# Patient Record
Sex: Male | Born: 1945 | Race: White | Hispanic: No | Marital: Married | Smoking: Never smoker
Health system: Southern US, Community
[De-identification: ages and names within clinical notes are randomized; demographics above are authoritative.]

## PROBLEM LIST (undated history)

## (undated) DIAGNOSIS — J329 Chronic sinusitis, unspecified: Secondary | ICD-10-CM

## (undated) DIAGNOSIS — G4733 Obstructive sleep apnea (adult) (pediatric): Secondary | ICD-10-CM

## (undated) DIAGNOSIS — F419 Anxiety disorder, unspecified: Secondary | ICD-10-CM

## (undated) DIAGNOSIS — K219 Gastro-esophageal reflux disease without esophagitis: Secondary | ICD-10-CM

## (undated) DIAGNOSIS — F4310 Post-traumatic stress disorder, unspecified: Secondary | ICD-10-CM

## (undated) DIAGNOSIS — G629 Polyneuropathy, unspecified: Secondary | ICD-10-CM

## (undated) DIAGNOSIS — M545 Low back pain, unspecified: Secondary | ICD-10-CM

## (undated) DIAGNOSIS — J309 Allergic rhinitis, unspecified: Secondary | ICD-10-CM

## (undated) DIAGNOSIS — G894 Chronic pain syndrome: Secondary | ICD-10-CM

## (undated) DIAGNOSIS — Z9989 Dependence on other enabling machines and devices: Secondary | ICD-10-CM

## (undated) DIAGNOSIS — M1990 Unspecified osteoarthritis, unspecified site: Secondary | ICD-10-CM

## (undated) DIAGNOSIS — M5416 Radiculopathy, lumbar region: Secondary | ICD-10-CM

## (undated) DIAGNOSIS — IMO0002 Reserved for concepts with insufficient information to code with codable children: Secondary | ICD-10-CM

## (undated) DIAGNOSIS — J45909 Unspecified asthma, uncomplicated: Secondary | ICD-10-CM

## (undated) DIAGNOSIS — J449 Chronic obstructive pulmonary disease, unspecified: Secondary | ICD-10-CM

## (undated) DIAGNOSIS — E162 Hypoglycemia, unspecified: Secondary | ICD-10-CM

## (undated) DIAGNOSIS — I10 Essential (primary) hypertension: Secondary | ICD-10-CM

## (undated) DIAGNOSIS — M4306 Spondylolysis, lumbar region: Secondary | ICD-10-CM

## (undated) DIAGNOSIS — C61 Malignant neoplasm of prostate: Secondary | ICD-10-CM

## (undated) DIAGNOSIS — J40 Bronchitis, not specified as acute or chronic: Secondary | ICD-10-CM

## (undated) HISTORY — PX: MOHS SURGERY: SUR867

## (undated) HISTORY — PX: UPPER GI ENDOSCOPY: SHX6162

## (undated) HISTORY — PX: COLONOSCOPY: SHX174

## (undated) HISTORY — PX: BACK SURGERY: SHX140

---

## 2013-02-02 ENCOUNTER — Telehealth: Payer: Self-pay | Admitting: Genetic Counselor

## 2013-02-02 NOTE — Telephone Encounter (Signed)
S/W PT IN RE TO GENETIC APPT 07/10 @ 1 W/KAREN POWELL. WELCOME PACKET MAILED.

## 2013-03-11 ENCOUNTER — Telehealth: Payer: Self-pay | Admitting: Oncology

## 2013-03-11 ENCOUNTER — Other Ambulatory Visit: Payer: Self-pay | Admitting: Lab

## 2013-03-11 ENCOUNTER — Encounter: Payer: Medicare Other | Admitting: Genetic Counselor

## 2013-03-11 NOTE — Telephone Encounter (Signed)
PT SCHEDULED TO SEE DR. SHADAD 7/29 @ 10:30.  WELCOME PACKET MAILED.

## 2013-03-26 ENCOUNTER — Other Ambulatory Visit: Payer: Self-pay | Admitting: Oncology

## 2013-03-26 DIAGNOSIS — C61 Malignant neoplasm of prostate: Secondary | ICD-10-CM

## 2013-03-30 ENCOUNTER — Other Ambulatory Visit (HOSPITAL_BASED_OUTPATIENT_CLINIC_OR_DEPARTMENT_OTHER): Payer: Medicare Other | Admitting: Lab

## 2013-03-30 ENCOUNTER — Telehealth: Payer: Self-pay | Admitting: Oncology

## 2013-03-30 ENCOUNTER — Encounter: Payer: Self-pay | Admitting: Oncology

## 2013-03-30 ENCOUNTER — Ambulatory Visit: Payer: Medicare Other

## 2013-03-30 ENCOUNTER — Ambulatory Visit (HOSPITAL_BASED_OUTPATIENT_CLINIC_OR_DEPARTMENT_OTHER): Payer: Medicare Other | Admitting: Oncology

## 2013-03-30 VITALS — BP 108/81 | HR 66 | Temp 97.1°F | Resp 19 | Ht 69.0 in | Wt 224.5 lb

## 2013-03-30 DIAGNOSIS — C61 Malignant neoplasm of prostate: Secondary | ICD-10-CM

## 2013-03-30 LAB — COMPREHENSIVE METABOLIC PANEL (CC13)
Albumin: 3.9 g/dL (ref 3.5–5.0)
Alkaline Phosphatase: 53 U/L (ref 40–150)
BUN: 20.5 mg/dL (ref 7.0–26.0)
CO2: 28 mEq/L (ref 22–29)
Calcium: 9.7 mg/dL (ref 8.4–10.4)
Glucose: 88 mg/dl (ref 70–140)
Potassium: 3.7 mEq/L (ref 3.5–5.1)
Sodium: 143 mEq/L (ref 136–145)
Total Protein: 7 g/dL (ref 6.4–8.3)

## 2013-03-30 LAB — CBC WITH DIFFERENTIAL/PLATELET
Basophils Absolute: 0 10*3/uL (ref 0.0–0.1)
Eosinophils Absolute: 0.1 10*3/uL (ref 0.0–0.5)
HGB: 16.1 g/dL (ref 13.0–17.1)
MCV: 90.2 fL (ref 79.3–98.0)
MONO#: 0.4 10*3/uL (ref 0.1–0.9)
MONO%: 7.3 % (ref 0.0–14.0)
NEUT#: 3.5 10*3/uL (ref 1.5–6.5)
Platelets: 166 10*3/uL (ref 140–400)
RBC: 5.15 10*6/uL (ref 4.20–5.82)
RDW: 14.9 % — ABNORMAL HIGH (ref 11.0–14.6)
WBC: 5.6 10*3/uL (ref 4.0–10.3)

## 2013-03-30 LAB — TESTOSTERONE: Testosterone: 186 ng/dL — ABNORMAL LOW (ref 300–890)

## 2013-03-30 LAB — PSA: PSA: 5.95 ng/mL — ABNORMAL HIGH (ref <=4.00)

## 2013-03-30 NOTE — Telephone Encounter (Signed)
Pt sched with Dr. Laverle Patter Aug 12@ 4:15pm

## 2013-03-30 NOTE — Progress Notes (Signed)
Note dictated

## 2013-03-30 NOTE — Progress Notes (Signed)
Checked in new pt with no financial concerns. °

## 2013-03-31 NOTE — Progress Notes (Signed)
CC:   Maurice Farmer  REASON FOR CONSULTATION:  Prostate cancer.  HISTORY OF PRESENT ILLNESS:  Maurice Farmer is a pleasant gentleman currently of [place], [place], where he lived the majority of his life.  He is currently retired.  He worked in [employer], predominantly in Airline pilot.  He is a gentleman with a past medical history significant for asthma, GERD and hyperlipidemia, who he gets his routine medical care in [place] as well as the [facility], where he gets his medication and part of his primary care.  He was noted over the last year and a half a slight elevation in his PSA.  His PSA was up to 12.7 back in March of 2014 and previous to that in __________, his PSA was normal at 2.7.  He was initially treated for possible prostatitis by Dr. Barton Dubois, a local urologist in [place], [place].  Subsequently in May of 2014, his PSA dropped down to 7.1.  At that time it was felt, given the fact that his PSA was persistently high, a biopsy would be recommended.  Then on Jan 12, 2013, he underwent a prostatic biopsy. The pathology report was reviewed today, and the case number is [id number], showed a small focus of atypical glands that are highly suspicious for prostatic adenocarcinoma.  That is the prostate of the right upper lobe.  There are benign seminal vesicles.  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  At that time, he was recommended by his local urologist to have a repeat biopsy and he discussed this with his primary care physician, who recommended referral to the [facility] for another opinion. Clinically, Maurice Farmer is mildly symptomatic.  He does report symptoms of fatigue and tiredness, but he had been on testosterone supplement in the past that have been discontinued and he feels slightly sluggish from that.  He has also had  a problem with erectile dysfunction, but he has not had any major urination issues.  He does report some frequency, especially when he drinks excessive amounts of water at nighttime and some nocturia, but really no hesitancy, no hematuria and incontinence.  REVIEW OF SYSTEMS:  Rest of the review of systems:  He did not report any headaches or blurry vision or double vision.  Did not report any abdominal pain.  Does not report any shortness of breath or cough or hemoptysis.  Is not reporting any bony pains.  Did not report back pain. He does have arthritic hip pain, but that has been chronic and has not really changed in nature.  Did not report any GI symptoms.  Rest of review of systems is unremarkable.  PAST MEDICAL HISTORY:  Significant for asthma, GERD, hyperlipidemia, and hypertension.  FAMILY HISTORY:  Really no history of any malignant disorders.  No history of any prostate cancer or genitourinary cancers.  SOCIAL HISTORY:  He is married.  He is retired.  Chews tobacco.  Denied any alcohol abuse.  MEDICATIONS:  He is currently on albuterol, Xanax, aspirin, Lipitor, __________, vitamin D, Benadryl as needed, fish oil supplements, Flonase, Foradil, nitroglycerin, Prilosec, Percocet, Restoril and vitamin C.  ALLERGIES:  None.  PHYSICAL EXAMINATION:  Alert, awake gentleman, appeared in no active distress.  His blood pressure is 108/81, pulse 66, respiration 19, temperature is 97.  Weight is 224 pounds.  Head is normocephalic, atraumatic.  Pupils equal and round, reactive to light.  Oral mucosa moist and pink.  Neck:  Supple.  No lymphadenopathy.  Heart:  Regular rate and rhythm.  S1 and S2.  Lungs:  Clear to auscultation.  No rhonchi, wheeze, dullness to percussion.  Abdomen:  Soft, nontender.  No hepatosplenomegaly.  Extremities:  No clubbing, cyanosis or edema. Neurological:  Intact motor, sensory and deep tendon reflexes.  LABORATORY DATA:  Hemoglobin of 16, white count  5.6, platelet count 166. He had chemistries and normal alkaline phosphatase, normal liver function tests.  ASSESSMENT AND PLAN:  This is a pleasant 67 year old gentleman found to have an elevated PSA of 12.7 back in March 2014, went down to 7.1 in May of 2014, and underwent a biopsy that is suggestive of possible neoplasm and possible adenocarcinoma of the prostate.  I had a lengthy discussion today with Maurice Farmer, discussing the natural course of prostate cancer as well as the different kinds as well as Gleason score and PSA implications, as well as different treatment modalities should he have developed prostate cancer.  At this time I do concur that he needs a repeat biopsy, also probably a review of the previous biopsy as well. Once that repeat biopsy had been accomplished, I have discussed with him the risks and benefits of different treatment approaches that include observation, surveillance, external beam radiation and possible brachytherapy and radical prostatectomy and all potential treatment options; but at this point, I think we have to start with confirming the diagnosis and also not only confirming the presence of prostate cancer, but also stratification with a Gleason score and possibly prostatic MRI to really determine the best course of action.  I will make the referral to [facility], specifically Dr. Heloise Purpura, for urological evaluation and after that, a further medical oncology followup will be dictated by the course of treatment.  All his questions were answered today.    ______________________________ Benjiman Core, M.D. FNS/MEDQ  D:  03/30/2013  T:  03/31/2013  Job:  [id number]

## 2013-04-12 ENCOUNTER — Telehealth: Payer: Self-pay | Admitting: Oncology

## 2013-04-12 NOTE — Telephone Encounter (Signed)
Faxed pt medical records to [facility] °

## 2013-05-18 ENCOUNTER — Ambulatory Visit: Payer: Medicare Other

## 2013-05-18 ENCOUNTER — Ambulatory Visit: Payer: Medicare Other | Admitting: Radiation Oncology

## 2013-07-08 ENCOUNTER — Other Ambulatory Visit: Payer: Self-pay

## 2013-07-14 ENCOUNTER — Other Ambulatory Visit (HOSPITAL_COMMUNITY): Payer: Self-pay | Admitting: Urology

## 2013-07-14 DIAGNOSIS — C61 Malignant neoplasm of prostate: Secondary | ICD-10-CM

## 2013-07-30 ENCOUNTER — Ambulatory Visit (HOSPITAL_COMMUNITY)
Admission: RE | Admit: 2013-07-30 | Discharge: 2013-07-30 | Disposition: A | Discharge status: Home / Self Care | Payer: Medicare Other | Source: Ambulatory Visit | Attending: Urology | Admitting: Urology

## 2013-07-30 ENCOUNTER — Other Ambulatory Visit (HOSPITAL_COMMUNITY): Payer: Self-pay | Admitting: Urology

## 2013-07-30 DIAGNOSIS — C61 Malignant neoplasm of prostate: Secondary | ICD-10-CM

## 2013-07-30 DIAGNOSIS — N400 Enlarged prostate without lower urinary tract symptoms: Secondary | ICD-10-CM | POA: Insufficient documentation

## 2013-07-30 DIAGNOSIS — N433 Hydrocele, unspecified: Secondary | ICD-10-CM | POA: Insufficient documentation

## 2013-07-30 MED ORDER — GADOBENATE DIMEGLUMINE 529 MG/ML IV SOLN
20.0000 mL | Freq: Once | INTRAVENOUS | Status: AC | PRN
  Administered 2013-07-30: 20 mL via INTRAVENOUS

## 2017-02-07 ENCOUNTER — Other Ambulatory Visit: Payer: Self-pay | Admitting: Urology

## 2017-02-07 DIAGNOSIS — C61 Malignant neoplasm of prostate: Secondary | ICD-10-CM

## 2017-03-10 ENCOUNTER — Ambulatory Visit
Admission: RE | Admit: 2017-03-10 | Discharge: 2017-03-10 | Disposition: A | Discharge status: Home / Self Care | Payer: Medicare Other | Source: Ambulatory Visit | Attending: Urology | Admitting: Urology

## 2017-03-10 DIAGNOSIS — C61 Malignant neoplasm of prostate: Secondary | ICD-10-CM

## 2017-03-10 MED ORDER — GADOBENATE DIMEGLUMINE 529 MG/ML IV SOLN
20.0000 mL | Freq: Once | INTRAVENOUS | Status: AC | PRN
  Administered 2017-03-10: 20 mL via INTRAVENOUS

## 2017-10-03 HISTORY — PX: PROSTATE BIOPSY: SHX241

## 2017-12-30 ENCOUNTER — Telehealth: Payer: Self-pay | Admitting: Medical Oncology

## 2017-12-30 NOTE — Telephone Encounter (Signed)
I called pt to introduce myself as the Prostate Nurse Navigator and the Coordinator of the Prostate [place]. He states he is currently in [place] visiting his daughter who just had a baby. He will return to the states 01/07/09.  1. I confirmed with the patient he is aware of his referral to the clinic 01/09/18 arriving at 8:30am.  2. I discussed the format of the clinic and the physicians he will be seeing that day.  3. I discussed where the clinic is located and how to contact me.  4. I confirmed his address and informed him I would be mailing a packet of information and forms to be completed. I asked him to bring them with him the day of his appointment.   He voiced understanding of the above. I asked him to call me if he has any questions or concerns regarding his appointments or the forms he needs to complete.

## 2018-01-08 ENCOUNTER — Telehealth: Payer: Self-pay | Admitting: Medical Oncology

## 2018-01-08 NOTE — Progress Notes (Signed)
GU Location of Tumor / Histology: prostatic adenocarcinoma  If Prostate Cancer, Gleason Score is (3 + 4) and PSA is (8.79) on 10/03/2017. Volume: 71.8 cc  Maurice Farmer has been under active surveillance since August 2014. He was noted to have progression by upgrading in February 2019. Oncotype Dx was suggestive of a more aggressive tumor.   Biopsies of prostate (if applicable) revealed:    Past/Anticipated interventions by urology, if any: prostate biopsy, active surveillance, prostate biopsy, oncotype Dx, referral to [facility]  Past/Anticipated interventions by medical oncology, if any: no  Weight changes, if any: no  Bowel/Bladder complaints, if any: IPSS 9   Nausea/Vomiting, if any: no  Pain issues, if any:  no  SAFETY ISSUES:  Prior radiation? no  Pacemaker/ICD? no  Possible current pregnancy? no  Is the patient on methotrexate? no  Current Complaints / other details:  72 year old male.

## 2018-01-08 NOTE — Telephone Encounter (Signed)
Spoke with [name] to confirm appointment for [facility] 01/09/18 arriving at 8:00am. I reviewed location, valet parking, registration and reminded him to bring his completed medical forms. He voiced understanding.

## 2018-01-09 ENCOUNTER — Ambulatory Visit
Admission: RE | Admit: 2018-01-09 | Discharge: 2018-01-09 | Disposition: A | Discharge status: Home / Self Care | Payer: Medicare Other | Source: Ambulatory Visit | Attending: Radiation Oncology | Admitting: Radiation Oncology

## 2018-01-09 ENCOUNTER — Encounter: Payer: Self-pay | Admitting: General Practice

## 2018-01-09 ENCOUNTER — Telehealth: Payer: Self-pay | Admitting: Oncology

## 2018-01-09 ENCOUNTER — Inpatient Hospital Stay: Payer: Medicare Other | Attending: Oncology | Admitting: Oncology

## 2018-01-09 ENCOUNTER — Encounter: Payer: Self-pay | Admitting: Radiation Oncology

## 2018-01-09 ENCOUNTER — Encounter: Payer: Self-pay | Admitting: Medical Oncology

## 2018-01-09 ENCOUNTER — Other Ambulatory Visit: Payer: Self-pay

## 2018-01-09 VITALS — BP 109/88 | HR 95 | Temp 98.3°F | Resp 18 | Ht 70.0 in | Wt 232.2 lb

## 2018-01-09 DIAGNOSIS — Z79899 Other long term (current) drug therapy: Secondary | ICD-10-CM | POA: Insufficient documentation

## 2018-01-09 DIAGNOSIS — Z7982 Long term (current) use of aspirin: Secondary | ICD-10-CM | POA: Insufficient documentation

## 2018-01-09 DIAGNOSIS — Z85828 Personal history of other malignant neoplasm of skin: Secondary | ICD-10-CM | POA: Diagnosis not present

## 2018-01-09 DIAGNOSIS — C61 Malignant neoplasm of prostate: Secondary | ICD-10-CM | POA: Diagnosis present

## 2018-01-09 HISTORY — DX: Malignant neoplasm of prostate: C61

## 2018-01-09 NOTE — Telephone Encounter (Signed)
NO LOS 5/10 °

## 2018-01-09 NOTE — Consult Note (Signed)
Multi-Disciplinary Clinic     01/09/2018   --------------------------------------------------------------------------------   Maurice Farmer  MRN: [id number]  PRIMARY CARE:  Orma Render  DOB: 1945-11-13, 72 year old Male  REFERRING:  Zola Button, MD  SSN:-[id number]  PROVIDER:  Raynelle Bring, M.D.    LOCATION:  [facility] [phone]   --------------------------------------------------------------------------------   CC/HPI: CC: Prostate Cancer   PCP: Dr. Sallee Provencal  Location of consult: [facility]   Maurice Farmer follows up today for further discussion regarding options for management/treatment of his prostate cancer. He was recently diagnosed with upgraded Gleason 3+4=7 adenocarcinoma although still have relatively low volume disease. We did discuss continued active surveillance. Since his last visit, he did undergo Oncotype DX testing. This did suggest a worst risk classification including a 77% risk of having Gleason 4+3=7 disease or locally advanced disease at the time of radical prostatectomy. At also suggested an 18% risk of developing metastasis in 10 years and a 3% risk of death within 10 years.     ALLERGIES: No Allergies    MEDICATIONS: Omeprazole 20 mg tablet, delayed release  Adviar  Albuterol Sulfate  Aspirin Ec 81 mg tablet, delayed release  Atorvastatin Calcium 40 mg tablet  Benadryl Allergy 25 mg tablet  Cbd Oil  Centrum Silver  Cetirizine Hcl 10 mg tablet  Chlorthalidone 25 mg tablet  Fish Oil Concentrate 1000 MG Oral Capsule Oral  Flonase 50 MCG/ACT Nasal Suspension Nasal  Gabapentin 300 mg capsule  Montelukast Sodium 10 mg tablet  Mucinex  Oxycodone-Acetaminophen 10-325 MG Oral Tablet Oral  Potassium Chloride 20 meq tablet, extended release  Sinus Nasal Spray  Tamsulosin Hcl 0.4 mg capsule Oral  Vitamin D3 5,000 unit tablet Oral     GU PSH: Cystoscopy Prostate  Needle Biopsy - 10/03/2017 Robotic Radical Prostatectomy      PSH Notes: Destruction Of Malignant Lesion   NON-GU PSH: Surgical Pathology, Gross And Microscopic Examination For Prostate Needle - 10/03/2017    GU PMH: BPH w/LUTS, Benign prostatic hyperplasia (BPH) with straining on urination - 07/26/2015 ED due to arterial insufficiency, Erectile dysfunction due to arterial insufficiency - 07/26/2015 Prostate Cancer, Prostate cancer - 07/15/2015      PMH Notes:   1) Prostate cancer: He was noted to have an elevated PSA of 7.1 which prompted a prostate biopsy by Maurice Farmer on 01/12/13 which demonstrated HGPIN and atypical glands suspicious but not diagnostic for malignancy. He sought a second opinion and I initially evaluated him in August 2014. He proceeded with a repeat biopsy on 04/28/13 which confirmed Gleason 3+3=6 adenocarcinoma in 1 out of 10 biopsy cores. He has no family history of prostate cancer. After an initial discussion regarding his options for treatment/management, he elected to undergo active surveillance. He was noted to have progression by upgrading in February 2019.   ** Comorbidities include arthritis, asthma, dyslipidemia, hypertension, depression, and GERD.   Initial diagnosis: August 2014  TNM stage: cT1c Nx Mx  PSA: 7.1  Gleason score: 3+3=6  Biopsy (04/28/13): 1/12 cores positive -- L base (10%)  Prostate volume: 56.2 cc   Surveillance   Nov 2014: MRI - suspected areas of malignancy at L base (1.5 cm), R base (small), R apex (0.5 cm)  Dec 2014: 26 core biopsy - L lateral base (20%, 3+3=6), R mid (10%, 3+3=6), Vol 56.3 cc  Nov 2016: 12 core biopsy - R mid (10%, 3+3=6), Vol 59.0 cc  Jul  2018: MRI - Nodular BPH but no areas suspicious for high grade malignancy  Feb 2019: 12 core biopsy - L lateral apex (5%, 3+3=6), R apex (20%, 3+4=7), R lateral mid (10%, 3+3=6), R lateral apex (2%, 3+3=6), Vol 71.8 cc   Baseline urinary function: IPSS 2.  Baseline erectile  function: SHIM 14.   2) Testosterone deficiency: His symptoms have included fatigue and energy loss and he has had documented testosterone deficiency and been treated with compounded testosterone cream in the past.   Current treatment: Lifestyle changes   3) Erectile dysfunction:   Current treatment: Sildenafil prn   NON-GU PMH: Anxiety, Anxiety (Symptom) - 2014 Asthma, Asthma - 2014 Depression GERD Hypercholesterolemia Hypertension    FAMILY HISTORY: Chronic Obstructive Pulmonary Disease - Mother Osteoporosis - Mother   SOCIAL HISTORY: Marital Status: Married Preferred Language: English; Ethnicity: Not Hispanic Or Latino; Race: White Current Smoking Status: Patient has never smoked.   Tobacco Use Assessment Completed: Used Tobacco in last 30 days? Uses smokeless tobacco. Has never drank.  Drinks 2 caffeinated drinks per day.     Notes: Former smoker, Tobacco use, Alcohol Use, Marital History - Currently Married   REVIEW OF SYSTEMS:    GU Review Male:   Patient denies frequent urination, stream starts and stops, get up at night to urinate, hard to postpone urination, burning/ pain with urination, leakage of urine, have to strain to urinate , and trouble starting your streams.  Gastrointestinal (Lower):   Patient denies diarrhea and constipation.  Gastrointestinal (Upper):   Patient denies nausea and vomiting.  Constitutional:   Patient denies fever, night sweats, weight loss, and fatigue.  Skin:   Patient denies skin rash/ lesion and itching.  Eyes:   Patient denies blurred vision and double vision.  Ears/ Nose/ Throat:   Patient denies sore throat and sinus problems.  Hematologic/Lymphatic:   Patient denies swollen glands and easy bruising.  Cardiovascular:   Patient denies leg swelling and chest pains.  Respiratory:   Patient denies cough and shortness of breath.  Endocrine:   Patient denies excessive thirst.  Musculoskeletal:   Patient denies back pain and joint pain.   Neurological:   Patient denies headaches and dizziness.  Psychologic:   Patient denies depression and anxiety.   VITAL SIGNS: None   MULTI-SYSTEM PHYSICAL EXAMINATION:    Constitutional: Well-nourished. No physical deformities. Normally developed. Good grooming.     PAST DATA REVIEWED:  Source Of History:  Patient  Records Review:   Pathology Reports, Previous Patient Records   10/03/17 03/10/17 08/21/16 03/06/16 07/27/15 02/04/15 08/03/14 02/10/14  PSA  Total PSA 8.79 ng/mL 5.83 ng/mL 6.1 ng/dl 6.25  7.60  6.82  8.14  5.09     PROCEDURES: None   ASSESSMENT:      ICD-10 Details  1 GU:   Prostate Cancer - C61    PLAN:           Document Letter(s):  Created for Patient: Clinical Summary         Notes:   1. Prostate cancer: He has seen both Dr. Tammi Klippel and Dr. Alen Blew earlier this morning. After reviewing his situation in detail and further discussion with them, he has elected to tentatively proceed with radiation therapy. We reviewed his situation in detail today and specifically his Oncotype DX testing. He does place an increased priority on his longevity compared to his quality of life and does logically wish to proceed with treatment considering this information.   After reviewing options for  treatment, he is most interested in radiation therapy. Dr. Tammi Klippel has discussed proceeding with hypo fractionated radiation for 5 and half weeks. He will need fiducial marker placement and insertion of SpaceOAR. This will be scheduled. He will then be scheduled to see me in approximately 6 months to begin PSA surveillance.   Cc: Dr. Sallee Provencal  Dr. Tyler Pita  Dr. Zola Button        Next Appointment:      Next Appointment: 04/29/2018 10:15 AM    Appointment Type: Office Visit Established Patient    Location: [facility] [phone]    Provider: Raynelle Bring, M.D.    Reason for Visit: 6 mo ck      E & M CODE: I spent at least 22 minutes face to face  with the patient, more than 50% of that time was spent on counseling and/or coordinating care.

## 2018-01-09 NOTE — Progress Notes (Signed)
Hematology and Oncology Follow Up Visit  Maurice Farmer [id number] 13-Jul-1946 72 y.o. 01/09/2018 9:10 AM Sallee Provencal, MDPatterson, Herbie Baltimore, MD   Principle Diagnosis: 72 year old gentleman with prostate cancer diagnosed in August 2014.  At that time he had a PSA 7.1 and a Gleason score of 3+3 equal 6.   Prior Therapy:  Active surveillance under the care of Dr. Alinda Money with repeat biopsies in 2016 which showed Gleason score 3+3 equal 6.  MRI in July 2018 showed no suspicious for high-grade malignancy.  Current therapy: Under consideration to start definitive therapy.  Interim History: [name] presents today for a follow-up visit.  He is a pleasant gentleman I saw in consultation in 2014 when he was diagnosed with prostate cancer.  He has been followed with Dr. Vallarie Mare with active surveillance.  His most recent PSA in February 2019 was 8.79 and a repeat biopsy at that time showed a Gleason score 3+3 equal 6 and 2 cores and one core of 3+4 = 7 with 40% involvement.  Good type DX was done at the time which showed 77% chance of advanced pathology with a grade score of 7 or higher.  He also had about 18% chance of metastatic disease within 10 years and 3% chance of death within 10 years.  He was referred to the prostate cancer multidisciplinary clinic for discussion today.  He continues to feel well without any specific complaints.  He denies any hematuria or dysuria.  Denies any frequency or urgency.   He does not report any headaches, blurry vision, syncope or seizures. Does not report any fevers, chills or sweats.  Does not report any cough, wheezing or hemoptysis.  Does not report any chest pain, palpitation, orthopnea or leg edema.  Does not report any nausea, vomiting or abdominal pain.  Does not report any constipation or diarrhea.  Does not report any skeletal complaints.    Does not report frequency, urgency or hematuria.  Does not report any skin rashes or lesions. Does not report any heat  or cold intolerance.  Does not report any lymphadenopathy or petechiae.  Does not report any anxiety or depression.  Remaining review of systems is negative.    Medications: I have reviewed the patient's current medications.  Current Outpatient Medications  Medication Sig Dispense Refill  . albuterol (PROVENTIL HFA;VENTOLIN HFA) 108 (90 BASE) MCG/ACT inhaler Inhale 2 puffs into the lungs every 6 (six) hours as needed for wheezing.    Marland Kitchen ALPRAZolam (XANAX) 0.25 MG tablet Take 0.25 mg by mouth daily as needed for sleep. 1/2 to 1 tablet daily as needed for anxiety    . aspirin 81 MG tablet Take 81 mg by mouth daily.    Marland Kitchen atorvastatin (LIPITOR) 80 MG tablet Take 80 mg by mouth daily.    . chlorthalidone (HYGROTON) 50 MG tablet Take 50 mg by mouth daily. patient takes 1/2 tablet daily    . Cholecalciferol (VITAMIN D3) 2000 UNITS TABS Take 200 Units by mouth daily.    . diphenhydrAMINE (BENADRYL) 25 MG tablet Take 25 mg by mouth daily.    . fish oil-omega-3 fatty acids 1000 MG capsule Take 1 g by mouth 3 (three) times daily.    . fluticasone (FLONASE) 50 MCG/ACT nasal spray Place 2 sprays into the nose daily.    . formoterol (FORADIL) 12 MCG capsule for inhaler Place 12 mcg into inhaler and inhale daily.    . nitroGLYCERIN (NITROSTAT) 0.4 MG SL tablet Place 0.4 mg under the tongue every 5 (  five) minutes as needed for chest pain.    Marland Kitchen omeprazole (PRILOSEC) 20 MG capsule Take 20 mg by mouth daily.    Marland Kitchen oxyCODONE-acetaminophen (PERCOCET) 10-325 MG per tablet Take 1 tablet by mouth every 4 (four) hours as needed for pain.    Marland Kitchen temazepam (RESTORIL) 30 MG capsule Take 30 mg by mouth at bedtime as needed for sleep.    . vitamin C (ASCORBIC ACID) 500 MG tablet Take 500 mg by mouth 2 (two) times daily.     No current facility-administered medications for this visit.      Allergies: No Known Allergies  Past Medical History, Surgical history, Social history, and Family History were reviewed and  updated.   Physical Exam: ECOG: 0 General appearance: alert and cooperative appeared without distress. Head: Normocephalic, without obvious abnormality Oropharynx: No oral thrush or ulcers. Eyes: No scleral icterus.  Pupils are equal and round reactive to light. Lymph nodes: Cervical, supraclavicular, and axillary nodes normal. Heart:regular rate and rhythm, S1, S2 normal, no murmur, click, rub or gallop Lung:chest clear, no wheezing, rales, normal symmetric air entry Abdomin: soft, non-tender, without masses or organomegaly. Neurological: No motor, sensory deficits.  Intact deep tendon reflexes. Skin: No rashes or lesions.  No ecchymosis or petechiae. Musculoskeletal: No joint deformity or effusion. Psychiatric: Mood and affect are appropriate.    Lab Results: Lab Results  Component Value Date   WBC 5.6 03/30/2013   HGB 16.1 03/30/2013   HCT 46.4 03/30/2013   MCV 90.2 03/30/2013   PLT 166 03/30/2013     Chemistry      Component Value Date/Time   NA 143 03/30/2013 1048   K 3.7 03/30/2013 1048   CO2 28 03/30/2013 1048   BUN 20.5 03/30/2013 1048   CREATININE 1.08 07/30/2013 1018   CREATININE 1.3 03/30/2013 1048      Component Value Date/Time   CALCIUM 9.7 03/30/2013 1048   ALKPHOS 53 03/30/2013 1048   AST 21 03/30/2013 1048   ALT 24 03/30/2013 1048   BILITOT 0.73 03/30/2013 1048        Impression and Plan:   72 year old gentleman with prostate cancer diagnosed in 2014.  He had a PSA of 7.1 and a Gleason score 3+3 = 6.  And repeat biopsy in 2014 in 2016 which confirmed low volume disease.  Biopsy obtained in February 2019 showed a Gleason score 3+4 = 7 in addition to his 2 cores of 3+3 = 6.  His case was discussed today in the prostate cancer multidisciplinary clinic.  Treatment options were reviewed today with the patient which include active surveillance versus radical prostatectomy or radiation therapy with short course of androgen deprivation.  The rationale  for using these approaches were discussed today as well as potential complication associated with therapy.  I also discussed with him in detail his risk of developing metastatic disease which is quantified is around 18% in 10 years and the ramification of using systemic therapy at that time.  He understands that there is no curative option if that happens and any treatment is palliative.  After discussion today, he is leaning towards getting definitive treatment with radiation.  15  minutes was spent with the patient face-to-face today.  More than 50% of time was dedicated to patient counseling, education and answering question regarding future plan of care.   Zola Button, MD 5/10/20199:10 AM

## 2018-01-09 NOTE — Progress Notes (Signed)
                               Care Plan Summary  Name: Maurice Farmer DOB: Nov 22, 1945   Your Medical Team:   Urologist -  Dr. Raynelle Bring, [facility]  Radiation Oncologist - Dr. Tyler Pita, [facility]   Medical Oncologist - Dr. Zola Button, [place]  Recommendations: 1) Prostatectomy 2) Radiation - 5 1/2 weeks IMRT   * These recommendations are based on information available as of today's consult.      Recommendations may change depending on the results of further tests or exams.  Next Steps: 1) Dr. Lynne Logan office will schedule appointment for fiducial markers and SpaceOAR 2 )Dr. Johny Shears office will schedule radiation planning and treatments  When appointments need to be scheduled, you will be contacted by [facility] and/or [facility].  Patient provided with business cards for all team members and a copy of "Fall Prevention Patient Safety Sheet".  Questions?  Please do not hesitate to call Cira Rue, RN, BSN, OCN at [phone] any questions or concerns.  Shirlean Mylar is your Oncology Nurse Navigator and is available to assist you while you're receiving your medical care at [facility].

## 2018-01-09 NOTE — Progress Notes (Signed)
Radiation Oncology         [phone]) [phone] ________________________________  Initial outpatient Consultation  Name: Maurice Farmer MRN: [id number]  Date: 01/09/2018  DOB: July 24, 1946  AY:[id number], Maurice [place], MD  Maurice Bring, MD   REFERRING PHYSICIAN: Raynelle Bring, MD  DIAGNOSIS: 72 year-old gentleman with Stage T1c adenocarcinoma of the prostate with Gleason Score of 3+4, and PSA of 8.79.    ICD-10-CM   1. Malignant neoplasm of prostate ([place]) C61     HISTORY OF PRESENT ILLNESS: Maurice Farmer is a 72 y.o. male with a diagnosis of prostate cancer. The patient has been under active surveillance since August 2014. Original prostate biopsy was prompted from elevated PSA of 7.1 and prostate biopsy by Dr. Alison Farmer on 01/12/2013 demonstrated HGPIN and atypical glands suspicious but not diagnostic for malignancy. Repeat prostate biopsy on 04/28/2013 with Dr. Alinda Farmer confirmed Gleason 3+3 in 1 of 10 biopsy cores. He has no family history of prostate cancer.  He had 2 additional prostate biopsies in 2016 which were consistent with Gleason 3+3 disease.  A prostate MRI was performed on 03/10/2017 for further evaluation and showed no evidence of high-grade carcinoma within the peripheral zone. There was nodular transitional zone consistent benign prostate hypertrophy.   His PSA increased to 8.79 in February 2019 which led to repeat biopsy which showed Gleason 3+4 disease in the right apex. The prostate volume measured 71.8 cc.  Out of 12 core biopsies, 4 were positive.  The maximum Gleason score was 3+4, and this was seen in the right apex. Additionally, there was Gleason 3+3 in the left apex lateral, right mid lateral, and right apex lateral. An Oncotype Dx was performed and is suggestive of a more aggressive tumor.  The patient reviewed the biopsy results with his urologist and he has kindly been referred today for discussion of potential radiation treatment options.   He is accompanied by his  mother-in-law today. He lives in [place] but is willing to travel to [place] for daily treatment. Patient was in the [place] War and states he was likely exposed to [employer].     PREVIOUS RADIATION THERAPY: No  PAST MEDICAL HISTORY:  Past Medical History:  Diagnosis Date  . Prostate cancer ([place])   . Skin cancer       PAST SURGICAL HISTORY: Past Surgical History:  Procedure Laterality Date  . MOHS SURGERY    . PROSTATE BIOPSY      FAMILY HISTORY:  Family History  Problem Relation Age of Onset  . Cancer Neg Hx     SOCIAL HISTORY:  Social History   Socioeconomic History  . Marital status: Married    Spouse name: Maurice Farmer  . Number of children: 2  . Years of education: Not on file  . Highest education level: Not on file  Occupational History  . Not on file  Social Needs  . Financial resource strain: Not on file  . Food insecurity:    Worry: Not on file    Inability: Not on file  . Transportation needs:    Medical: Not on file    Non-medical: Not on file  Tobacco Use  . Smoking status: Never Smoker  . Smokeless tobacco: Never Used  Substance and Sexual Activity  . Alcohol use: Never    Frequency: Never  . Drug use: Never  . Sexual activity: Not Currently  Lifestyle  . Physical activity:    Days per week: Not on file    Minutes per session: Not on file  .  Stress: Not on file  Relationships  . Social connections:    Talks on phone: Not on file    Gets together: Not on file    Attends religious service: Not on file    Active member of club or organization: Not on file    Attends meetings of clubs or organizations: Not on file    Relationship status: Not on file  . Intimate partner violence:    Fear of current or ex partner: Not on file    Emotionally abused: Not on file    Physically abused: Not on file    Forced sexual activity: Not on file  Other Topics Concern  . Not on file  Social History Narrative  . Not on file    ALLERGIES:  Patient has no known allergies.  MEDICATIONS:  Current Outpatient Medications  Medication Sig Dispense Refill  . montelukast (SINGULAIR) 10 MG tablet Take by mouth.    . potassium chloride SA (K-DUR,KLOR-CON) 20 MEQ tablet TAKE 1 TABLET(20 MEQ) BY MOUTH TWICE DAILY    . Spacer/Aero-Holding Chambers (AEROCHAMBER W/FLOWSIGNAL) inhaler Use as instructed.    Marland Kitchen albuterol (PROVENTIL HFA;VENTOLIN HFA) 108 (90 BASE) MCG/ACT inhaler Inhale 2 puffs into the lungs every 6 (six) hours as needed for wheezing.    Marland Kitchen ALPRAZolam (XANAX) 0.25 MG tablet Take 0.25 mg by mouth daily as needed for sleep. 1/2 to 1 tablet daily as needed for anxiety    . aspirin 81 MG tablet Take 81 mg by mouth daily.    Marland Kitchen atorvastatin (LIPITOR) 80 MG tablet Take 80 mg by mouth daily.    . cetirizine (ZYRTEC) 10 MG tablet   3  . chlorthalidone (HYGROTON) 50 MG tablet Take 50 mg by mouth daily. patient takes 1/2 tablet daily    . Cholecalciferol (VITAMIN D3) 2000 UNITS TABS Take 200 Units by mouth daily.    . diphenhydrAMINE (BENADRYL) 25 MG tablet Take 25 mg by mouth daily.    Marland Kitchen EPINEPHrine (EPIPEN 2-PAK) 0.3 mg/0.3 mL IJ SOAJ injection Inject into the muscle.    . fish oil-omega-3 fatty acids 1000 MG capsule Take 1 g by mouth 3 (three) times daily.    . fluticasone (FLONASE) 50 MCG/ACT nasal spray Flonase Allergy Relief 50 mcg/actuation nasal spray,suspension  Spray 1 spray every day by intranasal route.    . Fluticasone-Salmeterol (ADVAIR DISKUS) 100-50 MCG/DOSE AEPB Advair Diskus 100 mcg-50 mcg/dose powder for inhalation  Inhale 1 puff twice a day by inhalation route.    . formoterol (FORADIL) 12 MCG capsule for inhaler Place 12 mcg into inhaler and inhale daily.    Marland Kitchen gabapentin (NEURONTIN) 400 MG capsule   0  . morphine (MS CONTIN) 15 MG 12 hr tablet Take by mouth.    . nitroGLYCERIN (NITROSTAT) 0.4 MG SL tablet Place 0.4 mg under the tongue every 5 (five) minutes as needed for chest pain.    . Omega-3 Fatty Acids (FISH OIL)  1000 MG CAPS Take by mouth.    Marland Kitchen omeprazole (PRILOSEC) 20 MG capsule Take 20 mg by mouth daily.    Marland Kitchen oxyCODONE-acetaminophen (PERCOCET) 10-325 MG per tablet Take 1 tablet by mouth every 4 (four) hours as needed for pain.    . tamsulosin (FLOMAX) 0.4 MG CAPS capsule Take by mouth.    . temazepam (RESTORIL) 30 MG capsule Take 30 mg by mouth at bedtime as needed for sleep.    Marland Kitchen tiZANidine (ZANAFLEX) 4 MG tablet Take by mouth.    . triamcinolone cream (  KENALOG) 0.1 % Apply topically.    . vitamin C (ASCORBIC ACID) 500 MG tablet Take 500 mg by mouth 2 (two) times daily.     No current facility-administered medications for this encounter.     REVIEW OF SYSTEMS:  On review of systems, the patient reports that he is doing well overall. He denies any chest pain, shortness of breath, cough, fevers, chills, night sweats, unintended weight changes. He denies any bowel disturbances, and denies abdominal pain, nausea or vomiting. Patient has asthma and uses Advair. He has not had to use his rescue inhaler in about 2 months. He takes gabapentin for neuropathy in his legs. Patient has arthritis in his lower back. He received nerve ablation and has been doing well since. Patient was in the [place] War and states he was likely exposed to [employer]. The patient is currently taking Tamsulosin BID and has "been on this for years". His IPSS was 9, indicating moderate urinary symptoms. He is not able to complete sexual activity. A complete review of systems is obtained and is otherwise negative.   PHYSICAL EXAM:  Wt Readings from Last 3 Encounters:  01/09/18 232 lb 3.2 oz (105.3 kg)  03/30/13 224 lb 8 oz (101.8 kg)   Temp Readings from Last 3 Encounters:  01/09/18 98.3 F (36.8 C) (Oral)  03/30/13 97.1 F (36.2 C) (Oral)   BP Readings from Last 3 Encounters:  01/09/18 109/88  03/30/13 108/81   Pulse Readings from Last 3 Encounters:  01/09/18 95  03/30/13 66   Pain Assessment Pain Score: 0-No  pain/10  In general this is a well appearing caucasian male in no acute distress. The patient is accompanied by his mother-in-law today. He is alert and oriented x4 and appropriate throughout the examination. HEENT reveals that the patient is normocephalic, atraumatic. EOMs are intact. PERRLA. Skin is intact without any evidence of gross lesions. Cardiovascular exam reveals a regular rate and rhythm, no clicks rubs or murmurs are auscultated. Chest is clear to auscultation bilaterally. Lymphatic assessment is performed and does not reveal any adenopathy in the cervical, supraclavicular, axillary, or inguinal chains. Abdomen has active bowel sounds in all quadrants and is intact. The abdomen is soft, non tender, non distended. Lower extremities are negative for pretibial pitting edema, deep calf tenderness, cyanosis or clubbing.    KPS = 100  100 - Normal; no complaints; no evidence of disease. 90   - Able to carry on normal activity; minor signs or symptoms of disease. 80   - Normal activity with effort; some signs or symptoms of disease. 38   - Cares for self; unable to carry on normal activity or to do active work. 60   - Requires occasional assistance, but is able to care for most of his personal needs. 50   - Requires considerable assistance and frequent medical care. 41   - Disabled; requires special care and assistance. 8   - Severely disabled; hospital admission is indicated although death not imminent. 55   - Very sick; hospital admission necessary; active supportive treatment necessary. 10   - Moribund; fatal processes progressing rapidly. 0     - Dead  Karnofsky DA, Abelmann [place], Craver LS and Burchenal [facility] [phone]) The use of the nitrogen mustards in the palliative treatment of carcinoma: with particular reference to bronchogenic carcinoma Cancer 1 634-56  LABORATORY DATA:  Lab Results  Component Value Date   WBC 5.6 03/30/2013   HGB 16.1 03/30/2013   HCT 46.4 03/30/2013  MCV 90.2  03/30/2013   PLT 166 03/30/2013   Lab Results  Component Value Date   NA 143 03/30/2013   K 3.7 03/30/2013   CO2 28 03/30/2013   Lab Results  Component Value Date   ALT 24 03/30/2013   AST 21 03/30/2013   ALKPHOS 53 03/30/2013   BILITOT 0.73 03/30/2013     RADIOGRAPHY: No results found.    IMPRESSION/PLAN: 1. 72 y.o. gentleman with Stage T1c adenocarcinoma of the prostate with Gleason Score of 3+4, and PSA of 8.79.  Today we reviewed the findings and workup thus far.  We discussed the natural history of prostate cancer.  We reviewed the the implications of T-stage, Gleason's Score, and PSA on decision-making and outcomes in prostate cancer.  We discussed radiation treatment in the management of prostate cancer with regard to the logistics and delivery of external beam radiation treatment as well as the logistics and delivery of prostate brachytherapy.  We compared and contrasted each of these approaches and also compared these against prostatectomy. The patient is not felt to be an ideal candidate for brachytherapy given his large prostate volume as well as obstructive voiding symptoms managed with maximum dose of Flomax daily. We discussed the potential of causing increased prostate swelling with brachytherapy leading to AUR requiring an indwelling catheter for an unknown period of time.  However, if he adamantly wishes to proceed with brachytherapy, we could trial use of 5-alpha reductase inhibitor such as Proscar or Avodart to downsize the prostate for a period of 3-4 months and then re-size the prostate with CT measurement to determine if he is a candidate for brachytherapy at that time. We also discussed the use of SpaceOAR in reducing the rectal toxicities from radiotherapy.  At the conclusion of our conversation, the patient appears to be leaning towards a 5.5 week course of daily radiotherapy but remains undecided regarding his final treatment preference and would like some additional  time to consider his options. We will share our findings with Dr. Alinda Farmer and plan to follow up with the patient in the next 2 weeks to assess his readiness to commit to treatment.  Once he has reached a decision regarding treatment preference, we will move forward with scheduling treatment accordingly.  If he elects to proceed with 5.5 weeks of radiotherapy, we will arrange for follow up with Dr. Alinda Farmer for placement of fiducial markers and SpaceOAR.    Nicholos Johns, PA-C    Tyler Pita, MD  [facility] Direct Dial: [phone]  Fax: [phone] [email or web address]  Skype  LinkedIn  This document serves as a record of services personally performed by Tyler Pita, MD and Freeman Caldron, PA-C. It was created on their behalf by Arlyce Harman, a trained medical scribe. The creation of this record is based on the scribe's personal observations and the provider's statements to them. This document has been checked and approved by the attending provider.

## 2018-01-12 ENCOUNTER — Encounter: Payer: Self-pay | Admitting: Radiation Oncology

## 2018-01-12 ENCOUNTER — Telehealth: Payer: Self-pay | Admitting: Medical Oncology

## 2018-01-12 NOTE — Progress Notes (Signed)
Patient wishes to proceed with radiation. To review, he is a 72 y.o. gentleman with Stage T1c adenocarcinoma of the prostate with Gleason Score of 3+4, and PSA of 8.79 interested to proceed with  a 5.5 week course of daily IMRT radiotherapy

## 2018-01-12 NOTE — Telephone Encounter (Signed)
[name] called stating he is ready to move forward with radiation. He would like to being the first week of July if possible. He will out of town June 23-25 for a trip to [place]. I informed him that [name]- Dr. Tammi Klippel will contact him about appointments. He voiced understanding.

## 2018-01-12 NOTE — Progress Notes (Signed)
[facility] Psychosocial Distress Screening Spiritual Care  Met with Maurice Farmer and [name] (wife Maurice Farmer was returning from helping with new grandbaby in [place]) in [facility] to introduce [facility] team/resources, reviewing distress screen per protocol.  The patient scored a 3 on the Psychosocial Distress Thermometer which indicates mild distress. Also assessed for distress and other psychosocial needs.   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 This builds significant meaning and purpose in his life, as well as equipping him with transferable skills to help him cope with the challenge of prostate cancer.  He and his MIL appear to have a very supportive relationship. Although he lives in [place], Maurice Farmer is aware of ongoing [place] programming availability here in [place] and plans to participate as he is able. Normalized and encouraged participation for [place] and Maurice Farmer, as well, for caregiver support.   Follow up needed: No. Per pt, no other needs at this time. He plans to reach out to [employer] Team as desired, but please also page if immediate needs arise or circumstances change. Thank you.   [place], [place], [facility] Pager [phone] Voicemail [phone]

## 2018-01-13 ENCOUNTER — Telehealth: Payer: Self-pay | Admitting: *Deleted

## 2018-01-13 NOTE — Telephone Encounter (Signed)
Returned patient's phone call, spoke with patient 

## 2018-01-28 ENCOUNTER — Telehealth: Payer: Self-pay | Admitting: *Deleted

## 2018-01-28 NOTE — Telephone Encounter (Signed)
Called patient to inform of fid. Marker and space oara placement for 02-19-18 @ 1 pm @ [facility], spoke with patient and he is aware of this procedure.

## 2018-01-29 ENCOUNTER — Other Ambulatory Visit: Payer: Self-pay | Admitting: Urology

## 2018-02-02 ENCOUNTER — Telehealth: Payer: Self-pay | Admitting: *Deleted

## 2018-02-02 NOTE — Telephone Encounter (Signed)
RETURNED PATIENT'S PHONE CALL, SPOKE WITH PATIENT. ?

## 2018-02-12 ENCOUNTER — Encounter (HOSPITAL_BASED_OUTPATIENT_CLINIC_OR_DEPARTMENT_OTHER): Payer: Self-pay

## 2018-02-12 ENCOUNTER — Other Ambulatory Visit: Payer: Self-pay

## 2018-02-12 NOTE — Progress Notes (Signed)
Spoke with:  Dhanush NPO:  No food after midnight/Clear liquids until 7:00 AM DOS Arrival time:  1100AM Labs:  Istat 8, EKG AM medications: Bring Asthma Inhaler, Atorvastatin, Gabapentin, Omeprazole, Tamsulosin, Oxycodone and Nasal spray if needed, Use inhalers per normal routine  Pre op orders:  Yes Ride home: Jackelyn Poling (wife) [phone] Release order for ultrasound for ultrasound to schedule staffing

## 2018-02-12 NOTE — Pre-Procedure Instructions (Signed)
Released order for ultrasound for ultrasound to schedule staffing.

## 2018-02-17 ENCOUNTER — Other Ambulatory Visit: Payer: Self-pay | Admitting: Urology

## 2018-02-17 DIAGNOSIS — C61 Malignant neoplasm of prostate: Secondary | ICD-10-CM

## 2018-02-18 NOTE — H&P (Signed)
CC/HPI: CC: Prostate Cancer     Maurice Farmer follows up for further discussion regarding options for management/treatment of his prostate cancer. He was recently diagnosed with upgraded Gleason 3+4=7 adenocarcinoma although still have relatively low volume disease. We did discuss continued active surveillance. Since his last visit, he did undergo Oncotype DX testing. This did suggest a worst risk classification including a 77% risk of having Gleason 4+3=7 disease or locally advanced disease at the time of radical prostatectomy. At also suggested an 18% risk of developing metastasis in 10 years and a 3% risk of death within 10 years.     ALLERGIES: No Allergies    MEDICATIONS: Omeprazole 20 mg tablet, delayed release  Adviar  Albuterol Sulfate  Aspirin Ec 81 mg tablet, delayed release  Atorvastatin Calcium 40 mg tablet  Benadryl Allergy 25 mg tablet  Cbd Oil  Centrum Silver  Cetirizine Hcl 10 mg tablet  Chlorthalidone 25 mg tablet  Fish Oil Concentrate 1000 MG Oral Capsule Oral  Flonase 50 MCG/ACT Nasal Suspension Nasal  Gabapentin 300 mg capsule  Montelukast Sodium 10 mg tablet  Mucinex  Oxycodone-Acetaminophen 10-325 MG Oral Tablet Oral  Potassium Chloride 20 meq tablet, extended release  Sinus Nasal Spray  Tamsulosin Hcl 0.4 mg capsule Oral  Vitamin D3 5,000 unit tablet Oral     GU PSH: Cystoscopy Prostate Needle Biopsy - 10/03/2017 Robotic Radical Prostatectomy      PSH Notes: Destruction Of Malignant Lesion   NON-GU PSH: Surgical Pathology, Gross And Microscopic Examination For Prostate Needle - 10/03/2017    GU PMH: BPH w/LUTS, Benign prostatic hyperplasia (BPH) with straining on urination - 07/26/2015 ED due to arterial insufficiency, Erectile dysfunction due to arterial insufficiency - 07/26/2015 Prostate Cancer, Prostate cancer - 07/15/2015      PMH Notes:   1) Prostate cancer: He was noted to have an elevated PSA of 7.1 which prompted a prostate biopsy by Dr.  Alison Murray on 01/12/13 which demonstrated HGPIN and atypical glands suspicious but not diagnostic for malignancy. He sought a second opinion and I initially evaluated him in August 2014. He proceeded with a repeat biopsy on 04/28/13 which confirmed Gleason 3+3=6 adenocarcinoma in 1 out of 10 biopsy cores. He has no family history of prostate cancer. After an initial discussion regarding his options for treatment/management, he elected to undergo active surveillance. He was noted to have progression by upgrading in February 2019.   ** Comorbidities include arthritis, asthma, dyslipidemia, hypertension, depression, and GERD.   Initial diagnosis: August 2014  TNM stage: cT1c Nx Mx  PSA: 7.1  Gleason score: 3+3=6  Biopsy (04/28/13): 1/12 cores positive -- L base (10%)  Prostate volume: 56.2 cc   Surveillance   Nov 2014: MRI - suspected areas of malignancy at L base (1.5 cm), R base (small), R apex (0.5 cm)  Dec 2014: 26 core biopsy - L lateral base (20%, 3+3=6), R mid (10%, 3+3=6), Vol 56.3 cc  Nov 2016: 12 core biopsy - R mid (10%, 3+3=6), Vol 59.0 cc  Jul 2018: MRI - Nodular BPH but no areas suspicious for high grade malignancy  Feb 2019: 12 core biopsy - L lateral apex (5%, 3+3=6), R apex (20%, 3+4=7), R lateral mid (10%, 3+3=6), R lateral apex (2%, 3+3=6), Vol 71.8 cc   Baseline urinary function: IPSS 2.  Baseline erectile function: SHIM 14.   2) Testosterone deficiency: His symptoms have included fatigue and energy loss and he has had documented testosterone deficiency and been treated with compounded testosterone  cream in the past.   Current treatment: Lifestyle changes   3) Erectile dysfunction:   Current treatment: Sildenafil prn   NON-GU PMH: Anxiety, Anxiety (Symptom) - 2014 Asthma, Asthma - 2014 Depression GERD Hypercholesterolemia Hypertension    FAMILY HISTORY: Chronic Obstructive Pulmonary Disease - Mother Osteoporosis - Mother   SOCIAL HISTORY: Marital Status:  Married Preferred Language: English; Ethnicity: Not Hispanic Or Latino; Race: White Current Smoking Status: Patient has never smoked.   Tobacco Use Assessment Completed: Used Tobacco in last 30 days? Uses smokeless tobacco. Has never drank.  Drinks 2 caffeinated drinks per day.     Notes: Former smoker, Tobacco use, Alcohol Use, Marital History - Currently Married   REVIEW OF SYSTEMS:    GU Review Male:   Patient denies frequent urination, stream starts and stops, get up at night to urinate, hard to postpone urination, burning/ pain with urination, leakage of urine, have to strain to urinate , and trouble starting your streams.  Gastrointestinal (Lower):   Patient denies diarrhea and constipation.  Gastrointestinal (Upper):   Patient denies nausea and vomiting.  Constitutional:   Patient denies fever, night sweats, weight loss, and fatigue.  Skin:   Patient denies skin rash/ lesion and itching.  Eyes:   Patient denies blurred vision and double vision.  Ears/ Nose/ Throat:   Patient denies sore throat and sinus problems.  Hematologic/Lymphatic:   Patient denies swollen glands and easy bruising.  Cardiovascular:   Patient denies leg swelling and chest pains.  Respiratory:   Patient denies cough and shortness of breath.  Endocrine:   Patient denies excessive thirst.  Musculoskeletal:   Patient denies back pain and joint pain.  Neurological:   Patient denies headaches and dizziness.  Psychologic:   Patient denies depression and anxiety.   VITAL SIGNS: None   MULTI-SYSTEM PHYSICAL EXAMINATION:    Constitutional: Well-nourished. No physical deformities. Normally developed. Good grooming.     PAST DATA REVIEWED:  Source Of History:  Patient  Records Review:   Pathology Reports, Previous Patient Records   10/03/17 03/10/17 08/21/16 03/06/16 07/27/15 02/04/15 08/03/14 02/10/14  PSA  Total PSA 8.79 ng/mL 5.83 ng/mL 6.1 ng/dl 6.25  7.60  6.82  8.14  5.09     PROCEDURES: None    ASSESSMENT:      ICD-10 Details  1 GU:   Prostate Cancer - C61    PLAN:            Notes:   1. Prostate cancer:   After reviewing options for treatment, he is most interested in radiation therapy. Dr. Tammi Klippel has discussed proceeding with hypo fractionated radiation for 5 and half weeks. He will need fiducial marker placement and insertion of SpaceOAR.

## 2018-02-19 ENCOUNTER — Encounter (HOSPITAL_BASED_OUTPATIENT_CLINIC_OR_DEPARTMENT_OTHER)
Admission: RE | Disposition: A | Discharge status: Home / Self Care | Payer: Self-pay | Source: Ambulatory Visit | Attending: Urology

## 2018-02-19 ENCOUNTER — Ambulatory Visit (HOSPITAL_BASED_OUTPATIENT_CLINIC_OR_DEPARTMENT_OTHER): Payer: Medicare Other | Admitting: Anesthesiology

## 2018-02-19 ENCOUNTER — Other Ambulatory Visit: Payer: Self-pay

## 2018-02-19 ENCOUNTER — Ambulatory Visit (HOSPITAL_BASED_OUTPATIENT_CLINIC_OR_DEPARTMENT_OTHER)
Admission: RE | Admit: 2018-02-19 | Discharge: 2018-02-19 | Disposition: A | Discharge status: Home / Self Care | Payer: Medicare Other | Source: Ambulatory Visit | Attending: Urology | Admitting: Urology

## 2018-02-19 ENCOUNTER — Encounter (HOSPITAL_BASED_OUTPATIENT_CLINIC_OR_DEPARTMENT_OTHER): Payer: Self-pay | Admitting: Anesthesiology

## 2018-02-19 DIAGNOSIS — Z8262 Family history of osteoporosis: Secondary | ICD-10-CM | POA: Diagnosis not present

## 2018-02-19 DIAGNOSIS — Z7982 Long term (current) use of aspirin: Secondary | ICD-10-CM | POA: Insufficient documentation

## 2018-02-19 DIAGNOSIS — E291 Testicular hypofunction: Secondary | ICD-10-CM | POA: Insufficient documentation

## 2018-02-19 DIAGNOSIS — R0600 Dyspnea, unspecified: Secondary | ICD-10-CM | POA: Diagnosis not present

## 2018-02-19 DIAGNOSIS — C61 Malignant neoplasm of prostate: Secondary | ICD-10-CM | POA: Diagnosis present

## 2018-02-19 DIAGNOSIS — F329 Major depressive disorder, single episode, unspecified: Secondary | ICD-10-CM | POA: Diagnosis not present

## 2018-02-19 DIAGNOSIS — K219 Gastro-esophageal reflux disease without esophagitis: Secondary | ICD-10-CM | POA: Diagnosis not present

## 2018-02-19 DIAGNOSIS — Z79899 Other long term (current) drug therapy: Secondary | ICD-10-CM | POA: Insufficient documentation

## 2018-02-19 DIAGNOSIS — E7800 Pure hypercholesterolemia, unspecified: Secondary | ICD-10-CM | POA: Insufficient documentation

## 2018-02-19 DIAGNOSIS — M1990 Unspecified osteoarthritis, unspecified site: Secondary | ICD-10-CM | POA: Diagnosis not present

## 2018-02-19 DIAGNOSIS — J45909 Unspecified asthma, uncomplicated: Secondary | ICD-10-CM | POA: Diagnosis not present

## 2018-02-19 DIAGNOSIS — I10 Essential (primary) hypertension: Secondary | ICD-10-CM | POA: Insufficient documentation

## 2018-02-19 DIAGNOSIS — Z7951 Long term (current) use of inhaled steroids: Secondary | ICD-10-CM | POA: Insufficient documentation

## 2018-02-19 HISTORY — DX: Low back pain: M54.5

## 2018-02-19 HISTORY — DX: Chronic pain syndrome: G89.4

## 2018-02-19 HISTORY — DX: Radiculopathy, lumbar region: M54.16

## 2018-02-19 HISTORY — DX: Chronic obstructive pulmonary disease, unspecified: J44.9

## 2018-02-19 HISTORY — PX: GOLD SEED IMPLANT: SHX6343

## 2018-02-19 HISTORY — DX: Bronchitis, not specified as acute or chronic: J40

## 2018-02-19 HISTORY — DX: Dependence on other enabling machines and devices: Z99.89

## 2018-02-19 HISTORY — DX: Anxiety disorder, unspecified: F41.9

## 2018-02-19 HISTORY — DX: Obstructive sleep apnea (adult) (pediatric): G47.33

## 2018-02-19 HISTORY — DX: Unspecified osteoarthritis, unspecified site: M19.90

## 2018-02-19 HISTORY — DX: Spondylolysis, lumbar region: M43.06

## 2018-02-19 HISTORY — DX: Allergic rhinitis, unspecified: J30.9

## 2018-02-19 HISTORY — DX: Low back pain, unspecified: M54.50

## 2018-02-19 HISTORY — DX: Gastro-esophageal reflux disease without esophagitis: K21.9

## 2018-02-19 HISTORY — DX: Essential (primary) hypertension: I10

## 2018-02-19 HISTORY — DX: Hypoglycemia, unspecified: E16.2

## 2018-02-19 HISTORY — DX: Post-traumatic stress disorder, unspecified: F43.10

## 2018-02-19 HISTORY — DX: Polyneuropathy, unspecified: G62.9

## 2018-02-19 HISTORY — DX: Chronic sinusitis, unspecified: J32.9

## 2018-02-19 HISTORY — PX: SPACE OAR INSTILLATION: SHX6769

## 2018-02-19 HISTORY — DX: Unspecified asthma, uncomplicated: J45.909

## 2018-02-19 HISTORY — DX: Reserved for concepts with insufficient information to code with codable children: IMO0002

## 2018-02-19 LAB — POCT I-STAT, CHEM 8
BUN: 15 mg/dL (ref 6–20)
CALCIUM ION: 1.22 mmol/L (ref 1.15–1.40)
CHLORIDE: 99 mmol/L — AB (ref 101–111)
CREATININE: 1 mg/dL (ref 0.61–1.24)
GLUCOSE: 87 mg/dL (ref 65–99)
HCT: 43 % (ref 39.0–52.0)
Hemoglobin: 14.6 g/dL (ref 13.0–17.0)
POTASSIUM: 3.1 mmol/L — AB (ref 3.5–5.1)
Sodium: 141 mmol/L (ref 135–145)
TCO2: 28 mmol/L (ref 22–32)

## 2018-02-19 SURGERY — INSERTION, GOLD SEEDS
Anesthesia: General | Site: Prostate

## 2018-02-19 MED ORDER — OXYCODONE HCL 5 MG PO TABS
5.0000 mg | ORAL_TABLET | Freq: Once | ORAL | Status: AC
  Administered 2018-02-19: 5 mg via ORAL
  Filled 2018-02-19: qty 1

## 2018-02-19 MED ORDER — DEXAMETHASONE SODIUM PHOSPHATE 10 MG/ML IJ SOLN
INTRAMUSCULAR | Status: AC
  Filled 2018-02-19: qty 1

## 2018-02-19 MED ORDER — PROMETHAZINE HCL 25 MG/ML IJ SOLN
6.2500 mg | INTRAMUSCULAR | Status: DC | PRN
  Filled 2018-02-19: qty 1

## 2018-02-19 MED ORDER — LIDOCAINE 2% (20 MG/ML) 5 ML SYRINGE
INTRAMUSCULAR | Status: AC
  Filled 2018-02-19: qty 5

## 2018-02-19 MED ORDER — ONDANSETRON HCL 4 MG/2ML IJ SOLN
INTRAMUSCULAR | Status: DC | PRN
  Administered 2018-02-19: 4 mg via INTRAVENOUS

## 2018-02-19 MED ORDER — LACTATED RINGERS IV SOLN
INTRAVENOUS | Status: DC
  Filled 2018-02-19: qty 1000

## 2018-02-19 MED ORDER — CEFTRIAXONE SODIUM 1 G IJ SOLR
INTRAMUSCULAR | Status: AC
  Filled 2018-02-19: qty 10

## 2018-02-19 MED ORDER — FENTANYL CITRATE (PF) 100 MCG/2ML IJ SOLN
INTRAMUSCULAR | Status: DC | PRN
  Administered 2018-02-19: 50 ug via INTRAVENOUS

## 2018-02-19 MED ORDER — FENTANYL CITRATE (PF) 250 MCG/5ML IJ SOLN
INTRAMUSCULAR | Status: AC
  Filled 2018-02-19: qty 5

## 2018-02-19 MED ORDER — LIDOCAINE HCL (CARDIAC) PF 100 MG/5ML IV SOSY
PREFILLED_SYRINGE | INTRAVENOUS | Status: DC | PRN
  Administered 2018-02-19: 30 mg via INTRAVENOUS

## 2018-02-19 MED ORDER — PROPOFOL 10 MG/ML IV BOLUS
INTRAVENOUS | Status: DC | PRN
  Administered 2018-02-19: 200 mg via INTRAVENOUS
  Administered 2018-02-19: 50 mg via INTRAVENOUS

## 2018-02-19 MED ORDER — SODIUM CHLORIDE 0.9 % IV SOLN
INTRAVENOUS | Status: AC
  Filled 2018-02-19: qty 100

## 2018-02-19 MED ORDER — SODIUM CHLORIDE 0.9 % IV SOLN
INTRAVENOUS | Status: DC
  Administered 2018-02-19: 11:00:00 via INTRAVENOUS
  Filled 2018-02-19: qty 1000

## 2018-02-19 MED ORDER — OXYCODONE-ACETAMINOPHEN 5-325 MG PO TABS
1.0000 | ORAL_TABLET | Freq: Once | ORAL | Status: AC
  Administered 2018-02-19: 1 via ORAL
  Filled 2018-02-19: qty 1

## 2018-02-19 MED ORDER — FENTANYL CITRATE (PF) 100 MCG/2ML IJ SOLN
25.0000 ug | INTRAMUSCULAR | Status: DC | PRN
  Administered 2018-02-19: 25 ug via INTRAVENOUS
  Filled 2018-02-19: qty 1

## 2018-02-19 MED ORDER — SODIUM CHLORIDE 0.9 % IV SOLN
1.0000 g | Freq: Once | INTRAVENOUS | Status: AC
  Administered 2018-02-19: 1 g via INTRAVENOUS
  Filled 2018-02-19: qty 10

## 2018-02-19 MED ORDER — PROPOFOL 10 MG/ML IV BOLUS
INTRAVENOUS | Status: AC
  Filled 2018-02-19: qty 20

## 2018-02-19 MED ORDER — SODIUM CHLORIDE 0.9 % IJ SOLN
INTRAMUSCULAR | Status: AC
Start: 2018-02-19 — End: ?
  Filled 2018-02-19: qty 50

## 2018-02-19 MED ORDER — MEPERIDINE HCL 25 MG/ML IJ SOLN
6.2500 mg | INTRAMUSCULAR | Status: DC | PRN
  Filled 2018-02-19: qty 1

## 2018-02-19 MED ORDER — OXYCODONE-ACETAMINOPHEN 5-325 MG PO TABS
ORAL_TABLET | ORAL | Status: AC
  Filled 2018-02-19: qty 1

## 2018-02-19 MED ORDER — OXYCODONE HCL 5 MG PO TABS
ORAL_TABLET | ORAL | Status: AC
  Filled 2018-02-19: qty 1

## 2018-02-19 MED ORDER — LACTATED RINGERS IV SOLN
INTRAVENOUS | Status: DC | PRN
  Administered 2018-02-19: 14:00:00 via INTRAVENOUS

## 2018-02-19 MED ORDER — MIDAZOLAM HCL 2 MG/2ML IJ SOLN
0.5000 mg | Freq: Once | INTRAMUSCULAR | Status: DC | PRN
  Filled 2018-02-19: qty 2

## 2018-02-19 MED ORDER — SUGAMMADEX SODIUM 200 MG/2ML IV SOLN
INTRAVENOUS | Status: AC
  Filled 2018-02-19: qty 2

## 2018-02-19 MED ORDER — ONDANSETRON HCL 4 MG/2ML IJ SOLN
INTRAMUSCULAR | Status: AC
  Filled 2018-02-19: qty 2

## 2018-02-19 MED ORDER — FENTANYL CITRATE (PF) 100 MCG/2ML IJ SOLN
INTRAMUSCULAR | Status: AC
  Filled 2018-02-19: qty 2

## 2018-02-19 MED ORDER — SODIUM CHLORIDE FLUSH 0.9 % IV SOLN
INTRAVENOUS | Status: DC | PRN
  Administered 2018-02-19: 10 mL

## 2018-02-19 SURGICAL SUPPLY — 16 items
COVER TABLE BACK 60X90 (DRAPES) ×4 IMPLANT
DRSG TEGADERM 4X4.75 (GAUZE/BANDAGES/DRESSINGS) ×4 IMPLANT
DRSG TEGADERM 8X12 (GAUZE/BANDAGES/DRESSINGS) ×4 IMPLANT
GAUZE SPONGE 4X4 12PLY STRL (GAUZE/BANDAGES/DRESSINGS) ×4 IMPLANT
GLOVE BIO SURGEON STRL SZ7.5 (GLOVE) ×4 IMPLANT
GLOVE ECLIPSE 8.0 STRL XLNG CF (GLOVE) ×4 IMPLANT
GOWN STRL REUS W/TWL LRG LVL3 (GOWN DISPOSABLE) ×4 IMPLANT
GOWN STRL REUS W/TWL XL LVL3 (GOWN DISPOSABLE) ×4 IMPLANT
IMPL SPACEOAR SYSTEM 10ML (MISCELLANEOUS) ×2 IMPLANT
IMPLANT SPACEOAR SYSTEM 10ML (MISCELLANEOUS) ×4
KIT TURNOVER CYSTO (KITS) ×4 IMPLANT
MARKER GOLD PRELOAD 1.2X3 (Urological Implant) ×2 IMPLANT
PACK CYSTO (CUSTOM PROCEDURE TRAY) ×4 IMPLANT
SEED GOLD PRELOAD 1.2X3 (Urological Implant) ×4 IMPLANT
SURGILUBE 2OZ TUBE FLIPTOP (MISCELLANEOUS) ×4 IMPLANT
UNDERPAD 30X30 (UNDERPADS AND DIAPERS) ×8 IMPLANT

## 2018-02-19 NOTE — Anesthesia Preprocedure Evaluation (Addendum)
Anesthesia Evaluation  Patient identified by MRN, date of birth, ID band Patient awake    Reviewed: Allergy & Precautions, NPO status , Patient's Chart, lab work & pertinent test results  History of Anesthesia Complications Negative for: history of anesthetic complications  Airway Mallampati: II  TM Distance: >3 FB Neck ROM: Full    Dental  (+) Caps, Dental Advisory Given   Pulmonary sleep apnea and Continuous Positive Airway Pressure Ventilation , COPD (last inhaler needed months ago),  COPD inhaler,    breath sounds clear to auscultation       Cardiovascular hypertension, Pt. on medications (-) angina Rhythm:Regular Rate:Normal     Neuro/Psych PSYCHIATRIC DISORDERS (PTSD) Anxiety Chronic back pain: narcotics    GI/Hepatic Neg liver ROS, GERD  Medicated and Controlled,  Endo/Other  Morbid obesity  Renal/GU negative Renal ROS   Prostate cancer    Musculoskeletal   Abdominal (+) + obese,   Peds  Hematology negative hematology ROS (+)   Anesthesia Other Findings   Reproductive/Obstetrics                            Anesthesia Physical Anesthesia Plan  ASA: III  Anesthesia Plan: General   Post-op Pain Management:    Induction: Intravenous  PONV Risk Score and Plan: 2 and Ondansetron and Dexamethasone  Airway Management Planned: LMA  Additional Equipment:   Intra-op Plan:   Post-operative Plan:   Informed Consent: I have reviewed the patients History and Physical, chart, labs and discussed the procedure including the risks, benefits and alternatives for the proposed anesthesia with the patient or authorized representative who has indicated his/her understanding and acceptance.   Dental advisory given  Plan Discussed with: CRNA and Surgeon  Anesthesia Plan Comments: (Plan routine monitors, GA- LMA OK)        Anesthesia Quick Evaluation

## 2018-02-19 NOTE — Anesthesia Procedure Notes (Signed)
Procedure Name: LMA Insertion Date/Time: 02/19/2018 1:21 PM Performed by: Georgeanne Nim, CRNA Pre-anesthesia Checklist: Patient identified, Emergency Drugs available, Suction available, Patient being monitored and Timeout performed Patient Re-evaluated:Patient Re-evaluated prior to induction Oxygen Delivery Method: Circle system utilized Preoxygenation: Pre-oxygenation with 100% oxygen Induction Type: IV induction Ventilation: Mask ventilation without difficulty LMA: LMA inserted LMA Size: 4.0 Number of attempts: 1 Placement Confirmation: CO2 detector,  positive ETCO2 and breath sounds checked- equal and bilateral Tube secured with: Tape Dental Injury: Teeth and Oropharynx as per pre-operative assessment

## 2018-02-19 NOTE — Op Note (Signed)
Preoperative diagnosis: Clinically localized adenocarcinoma of the prostate (T1c Nx Mx)  Postoperative diagnosis: Clinically localized adenocarcinoma of the prostate  Procedure: 1) Transperineal placement of fiducial markers                    2) Insertion of SpaceOAR hydrogel   Surgeon: Pryor Curia. M.D.  Radiation oncologist: Dr. Ledon Snare  Anesthesia: General  EBL: Minimal  Complications: None  Indication: Maurice Farmer is a 72 y.o. gentleman with clinically localized prostate cancer. After discussing management options for treatment, he elected to proceed with radiotherapy. He presents today for the above procedures. The potential risks, complications, alternative options, and expected recovery course have been discussed in detail with the patient and he has provided informed consent to proceed.  Description of procedure: The patient was taken to the operating room and general anesthesia was induced. He was administered preoperative antibiotics, placed in the dorsal lithotomy position, and prepped and draped in the usual sterile fashion. Next, intraoperative transrectal ultrasonography was utilized for real-time intraoperative ultrasound guidance. 3 gold fiducial markers were then sequentially inserted through the perineum into the prostate at the left base, right lateral and left apical gland.  A site in the midline was selected on the perineum for placement of an 18 g needle with saline.  The needle was advanced above the rectum and below Denonvillier's fascia to the mid gland and confirmed to be in the midline on transverse imaging.  One cc of saline was injected confirming appropriate expansion of this space.  A total of 5 cc of saline was then injected to open the space further bilaterally.  The saline syringe was then removed and the SpaceOAR hydrogel was injected with good distribution bilaterally. He tolerated the procedure well and without complications. He was able to be  transferred to the recovery unit in satisfactory condition.  He was given a voiding trial in the PACU.

## 2018-02-19 NOTE — Discharge Instructions (Addendum)
Please call if fever > 101.  You may resume a normal diet and regular activity by tomorrow.   Post Anesthesia Home Care Instructions  Activity: Get plenty of rest for the remainder of the day. A responsible individual must stay with you for 24 hours following the procedure.  For the next 24 hours, DO NOT: -Drive a car -Paediatric nurse -Drink alcoholic beverages -Take any medication unless instructed by your physician -Make any legal decisions or sign important papers.  Meals: Start with liquid foods such as gelatin or soup. Progress to regular foods as tolerated. Avoid greasy, spicy, heavy foods. If nausea and/or vomiting occur, drink only clear liquids until the nausea and/or vomiting subsides. Call your physician if vomiting continues.  Special Instructions/Symptoms: Your throat may feel dry or sore from the anesthesia or the breathing tube placed in your throat during surgery. If this causes discomfort, gargle with warm salt water. The discomfort should disappear within 24 hours.  If you had a scopolamine patch placed behind your ear for the management of post- operative nausea and/or vomiting:  1. The medication in the patch is effective for 72 hours, after which it should be removed.  Wrap patch in a tissue and discard in the trash. Wash hands thoroughly with soap and water. 2. You may remove the patch earlier than 72 hours if you experience unpleasant side effects which may include dry mouth, dizziness or visual disturbances. 3. Avoid touching the patch. Wash your hands with soap and water after contact with the patch.    Brachytherapy for Prostate Cancer, Care After Refer to this sheet in the next few weeks. These instructions provide you with information on caring for yourself after your procedure. Your health care provider may also give you more specific instructions. Your treatment has been planned according to current medical practices, but problems sometimes occur. Call your  health care provider if you have any problems or questions after your procedure. What can I expect after the procedure? The area behind the scrotum will probably be tender and bruised. For a short period of time you may have:  Difficulty passing urine. You may need a catheter for a few days to a month.  Blood in the urine or semen.  A feeling of constipation because of prostate swelling.  Frequent feeling of an urgent need to urinate.  For a long period of time you may have:  Inflammation of the rectum. This happens in about 2% of people who have the procedure.  Erection problems. These vary with age and occur in about 15-40% of men.  Difficulty urinating. This is caused by scarring in the urethra.  Diarrhea.  Follow these instructions at home:  Take medicines only as directed by your health care provider.  You will probably have a catheter in your bladder for several days. You will have blood in the urine bag and should drink a lot of fluids to keep it a light red color.  Keep all follow-up visits as directed by your health care provider. If you have a catheter, it will be removed during one of these visits.  Try not to sit directly on the area behind the scrotum. A soft cushion can decrease the discomfort. Ice packs may also be helpful for the discomfort. Do not put ice directly on the skin.  Shower and wash the area behind the scrotum gently. Do not sit in a tub. Get help right away if  You have a fever.  You have chills.  You have shortness of breath.  You have chest pain.  You have thick blood, like tomato juice, in the urine bag.  Your catheter is blocked so urine cannot get into the bag. Your bladder area or lower abdomen may be swollen.  There is excessive bleeding from your rectum. It is normal to have a little blood mixed with your stool.  There is severe discomfort in the treated area that does not go away with pain medicine.  You have abdominal  discomfort.  You have severe nausea or vomiting.  You develop any new or unusual symptoms. This information is not intended to replace advice given to you by your health care provider. Make sure you discuss any questions you have with your health care provider. Document Released: 09/21/2010 Document Revised: 01/31/2016 Document Reviewed: 02/09/2013 Elsevier Interactive Patient Education  2017 [employer].

## 2018-02-19 NOTE — Transfer of Care (Signed)
Immediate Anesthesia Transfer of Care Note  Patient: Maurice Farmer  Procedure(s) Performed: GOLD SEED IMPLANT (N/A Prostate) SPACE OAR INSTILLATION (N/A Perineum)  Patient Location: PACU  Anesthesia Type:General  Level of Consciousness: awake and patient cooperative  Airway & Oxygen Therapy: Patient Spontanous Breathing and Patient connected to nasal cannula oxygen  Post-op Assessment: Report given to RN and Post -op Vital signs reviewed and stable  Post vital signs: Reviewed and stable  Last Vitals:  Vitals Value Taken Time  BP 103/75 02/19/2018  1:52 PM  Temp 36.5 C 02/19/2018  1:52 PM  Pulse 71 02/19/2018  1:54 PM  Resp 14 02/19/2018  1:55 PM  SpO2 96 % 02/19/2018  1:54 PM  Vitals shown include unvalidated device data.  Last Pain:  Vitals:   02/19/18 1103  TempSrc:   PainSc: 0-No pain      Patients Stated Pain Goal: 5 (02/58/52 7782)  Complications: No apparent anesthesia complications

## 2018-02-20 ENCOUNTER — Encounter (HOSPITAL_BASED_OUTPATIENT_CLINIC_OR_DEPARTMENT_OTHER): Payer: Self-pay | Admitting: Urology

## 2018-02-20 NOTE — Anesthesia Postprocedure Evaluation (Signed)
Anesthesia Post Note  Patient: Tyshon Fanning  Procedure(s) Performed: GOLD SEED IMPLANT (N/A Prostate) SPACE OAR INSTILLATION (N/A Perineum)     Patient location during evaluation: PACU Anesthesia Type: General Level of consciousness: awake and alert, oriented and patient cooperative Pain management: pain level controlled Vital Signs Assessment: post-procedure vital signs reviewed and stable Respiratory status: spontaneous breathing, nonlabored ventilation and respiratory function stable Cardiovascular status: blood pressure returned to baseline and stable Postop Assessment: no apparent nausea or vomiting Anesthetic complications: no Comments: Delayed entry, pt eval in PACU    Last Vitals:  Vitals:   02/19/18 1445 02/19/18 1545  BP: 103/71 111/75  Pulse: 70 63  Resp: 16 16  Temp:  36.5 C  SpO2: 95% 97%    Last Pain:  Vitals:   02/19/18 1545  TempSrc:   PainSc: 3                  Kynlie Jane,E. Marijane Trower

## 2018-02-24 ENCOUNTER — Encounter: Payer: Self-pay | Admitting: Medical Oncology

## 2018-02-26 ENCOUNTER — Ambulatory Visit
Admission: RE | Admit: 2018-02-26 | Discharge: 2018-02-26 | Disposition: A | Discharge status: Home / Self Care | Payer: Medicare Other | Source: Ambulatory Visit | Attending: Radiation Oncology | Admitting: Radiation Oncology

## 2018-02-26 ENCOUNTER — Ambulatory Visit (HOSPITAL_COMMUNITY)
Admission: RE | Admit: 2018-02-26 | Discharge: 2018-02-26 | Disposition: A | Discharge status: Home / Self Care | Payer: Medicare Other | Source: Ambulatory Visit | Attending: Urology | Admitting: Urology

## 2018-02-26 DIAGNOSIS — C61 Malignant neoplasm of prostate: Secondary | ICD-10-CM | POA: Insufficient documentation

## 2018-02-27 ENCOUNTER — Encounter: Payer: Self-pay | Admitting: Medical Oncology

## 2018-02-27 NOTE — Progress Notes (Signed)
  Radiation Oncology         [phone] ________________________________  Name: Maurice Farmer MRN: [id number]  Date: 02/26/2018  DOB: 09-Mar-1946  SIMULATION AND TREATMENT PLANNING NOTE    ICD-10-CM   1. Malignant neoplasm of prostate ([place]) C61     DIAGNOSIS:  72 y.o. gentleman with Stage T1c adenocarcinoma of the prostate with Gleason Score of 3+4, and PSA of 8.79   NARRATIVE:  The patient was brought to the [facility].  Identity was confirmed.  All relevant records and images related to the planned course of therapy were reviewed.  The patient freely provided informed written consent to proceed with treatment after reviewing the details related to the planned course of therapy. The consent form was witnessed and verified by the simulation staff.  Then, the patient was set-up in a stable reproducible supine position for radiation therapy.  A vacuum lock pillow device was custom fabricated to position his legs in a reproducible immobilized position.  Then, I performed a urethrogram under sterile conditions to identify the prostatic apex.  CT images were obtained.  Surface markings were placed.  The CT images were loaded into the planning software.  Then the prostate target and avoidance structures including the rectum, bladder, bowel and hips were contoured.  Treatment planning then occurred.  The radiation prescription was entered and confirmed.  A total of one complex treatment devices was fabricated. I have requested : Intensity Modulated Radiotherapy (IMRT) is medically necessary for this case for the following reason:  Rectal sparing.Marland Kitchen  PLAN:  The patient will receive 70 Gy in 28 fractions.  ________________________________  Sheral Apley Tammi Klippel, M.D.

## 2018-03-06 DIAGNOSIS — C61 Malignant neoplasm of prostate: Secondary | ICD-10-CM | POA: Diagnosis present

## 2018-03-06 DIAGNOSIS — Z510 Encounter for antineoplastic radiation therapy: Secondary | ICD-10-CM | POA: Diagnosis not present

## 2018-03-16 ENCOUNTER — Ambulatory Visit
Admission: RE | Admit: 2018-03-16 | Discharge: 2018-03-16 | Disposition: A | Discharge status: Home / Self Care | Payer: Medicare Other | Source: Ambulatory Visit | Attending: Radiation Oncology | Admitting: Radiation Oncology

## 2018-03-16 ENCOUNTER — Encounter: Payer: Self-pay | Admitting: Medical Oncology

## 2018-03-16 DIAGNOSIS — Z510 Encounter for antineoplastic radiation therapy: Secondary | ICD-10-CM | POA: Diagnosis not present

## 2018-03-17 ENCOUNTER — Ambulatory Visit
Admission: RE | Admit: 2018-03-17 | Discharge: 2018-03-17 | Disposition: A | Discharge status: Home / Self Care | Payer: Medicare Other | Source: Ambulatory Visit | Attending: Radiation Oncology | Admitting: Radiation Oncology

## 2018-03-17 DIAGNOSIS — Z510 Encounter for antineoplastic radiation therapy: Secondary | ICD-10-CM | POA: Diagnosis not present

## 2018-03-18 ENCOUNTER — Ambulatory Visit
Admission: RE | Admit: 2018-03-18 | Discharge: 2018-03-18 | Disposition: A | Discharge status: Home / Self Care | Payer: Medicare Other | Source: Ambulatory Visit | Attending: Radiation Oncology | Admitting: Radiation Oncology

## 2018-03-18 DIAGNOSIS — Z510 Encounter for antineoplastic radiation therapy: Secondary | ICD-10-CM | POA: Diagnosis not present

## 2018-03-19 ENCOUNTER — Ambulatory Visit
Admission: RE | Admit: 2018-03-19 | Discharge: 2018-03-19 | Disposition: A | Discharge status: Home / Self Care | Payer: Medicare Other | Source: Ambulatory Visit | Attending: Radiation Oncology | Admitting: Radiation Oncology

## 2018-03-19 DIAGNOSIS — Z510 Encounter for antineoplastic radiation therapy: Secondary | ICD-10-CM | POA: Diagnosis not present

## 2018-03-20 ENCOUNTER — Ambulatory Visit
Admission: RE | Admit: 2018-03-20 | Discharge: 2018-03-20 | Disposition: A | Discharge status: Home / Self Care | Payer: Medicare Other | Source: Ambulatory Visit | Attending: Radiation Oncology | Admitting: Radiation Oncology

## 2018-03-20 DIAGNOSIS — Z510 Encounter for antineoplastic radiation therapy: Secondary | ICD-10-CM | POA: Diagnosis not present

## 2018-03-23 ENCOUNTER — Ambulatory Visit
Admission: RE | Admit: 2018-03-23 | Discharge: 2018-03-23 | Disposition: A | Discharge status: Home / Self Care | Payer: Medicare Other | Source: Ambulatory Visit | Attending: Radiation Oncology | Admitting: Radiation Oncology

## 2018-03-23 DIAGNOSIS — Z510 Encounter for antineoplastic radiation therapy: Secondary | ICD-10-CM | POA: Diagnosis not present

## 2018-03-24 ENCOUNTER — Ambulatory Visit
Admission: RE | Admit: 2018-03-24 | Discharge: 2018-03-24 | Disposition: A | Discharge status: Home / Self Care | Payer: Medicare Other | Source: Ambulatory Visit | Attending: Radiation Oncology | Admitting: Radiation Oncology

## 2018-03-24 DIAGNOSIS — Z510 Encounter for antineoplastic radiation therapy: Secondary | ICD-10-CM | POA: Diagnosis not present

## 2018-03-25 ENCOUNTER — Ambulatory Visit
Admission: RE | Admit: 2018-03-25 | Discharge: 2018-03-25 | Disposition: A | Discharge status: Home / Self Care | Payer: Medicare Other | Source: Ambulatory Visit | Attending: Radiation Oncology | Admitting: Radiation Oncology

## 2018-03-25 DIAGNOSIS — Z510 Encounter for antineoplastic radiation therapy: Secondary | ICD-10-CM | POA: Diagnosis not present

## 2018-03-26 ENCOUNTER — Ambulatory Visit
Admission: RE | Admit: 2018-03-26 | Discharge: 2018-03-26 | Disposition: A | Discharge status: Home / Self Care | Payer: Medicare Other | Source: Ambulatory Visit | Attending: Radiation Oncology | Admitting: Radiation Oncology

## 2018-03-26 DIAGNOSIS — Z510 Encounter for antineoplastic radiation therapy: Secondary | ICD-10-CM | POA: Diagnosis not present

## 2018-03-27 ENCOUNTER — Ambulatory Visit
Admission: RE | Admit: 2018-03-27 | Discharge: 2018-03-27 | Disposition: A | Discharge status: Home / Self Care | Payer: Medicare Other | Source: Ambulatory Visit | Attending: Radiation Oncology | Admitting: Radiation Oncology

## 2018-03-27 DIAGNOSIS — Z510 Encounter for antineoplastic radiation therapy: Secondary | ICD-10-CM | POA: Diagnosis not present

## 2018-03-30 ENCOUNTER — Ambulatory Visit
Admission: RE | Admit: 2018-03-30 | Discharge: 2018-03-30 | Disposition: A | Discharge status: Home / Self Care | Payer: Medicare Other | Source: Ambulatory Visit | Attending: Radiation Oncology | Admitting: Radiation Oncology

## 2018-03-30 ENCOUNTER — Encounter: Payer: Self-pay | Admitting: Medical Oncology

## 2018-03-30 DIAGNOSIS — Z510 Encounter for antineoplastic radiation therapy: Secondary | ICD-10-CM | POA: Diagnosis not present

## 2018-03-31 ENCOUNTER — Ambulatory Visit
Admission: RE | Admit: 2018-03-31 | Discharge: 2018-03-31 | Disposition: A | Discharge status: Home / Self Care | Payer: Medicare Other | Source: Ambulatory Visit | Attending: Radiation Oncology | Admitting: Radiation Oncology

## 2018-03-31 DIAGNOSIS — Z510 Encounter for antineoplastic radiation therapy: Secondary | ICD-10-CM | POA: Diagnosis not present

## 2018-04-01 ENCOUNTER — Ambulatory Visit
Admission: RE | Admit: 2018-04-01 | Discharge: 2018-04-01 | Disposition: A | Discharge status: Home / Self Care | Payer: Medicare Other | Source: Ambulatory Visit | Attending: Radiation Oncology | Admitting: Radiation Oncology

## 2018-04-01 DIAGNOSIS — Z510 Encounter for antineoplastic radiation therapy: Secondary | ICD-10-CM | POA: Diagnosis not present

## 2018-04-02 ENCOUNTER — Ambulatory Visit
Admission: RE | Admit: 2018-04-02 | Discharge: 2018-04-02 | Disposition: A | Discharge status: Home / Self Care | Payer: Medicare Other | Source: Ambulatory Visit | Attending: Radiation Oncology | Admitting: Radiation Oncology

## 2018-04-02 DIAGNOSIS — C61 Malignant neoplasm of prostate: Secondary | ICD-10-CM | POA: Diagnosis present

## 2018-04-02 DIAGNOSIS — Z510 Encounter for antineoplastic radiation therapy: Secondary | ICD-10-CM | POA: Insufficient documentation

## 2018-04-03 ENCOUNTER — Ambulatory Visit
Admission: RE | Admit: 2018-04-03 | Discharge: 2018-04-03 | Disposition: A | Discharge status: Home / Self Care | Payer: Medicare Other | Source: Ambulatory Visit | Attending: Radiation Oncology | Admitting: Radiation Oncology

## 2018-04-03 DIAGNOSIS — Z510 Encounter for antineoplastic radiation therapy: Secondary | ICD-10-CM | POA: Diagnosis not present

## 2018-04-06 ENCOUNTER — Ambulatory Visit
Admission: RE | Admit: 2018-04-06 | Discharge: 2018-04-06 | Disposition: A | Discharge status: Home / Self Care | Payer: Medicare Other | Source: Ambulatory Visit | Attending: Radiation Oncology | Admitting: Radiation Oncology

## 2018-04-06 DIAGNOSIS — Z510 Encounter for antineoplastic radiation therapy: Secondary | ICD-10-CM | POA: Diagnosis not present

## 2018-04-07 ENCOUNTER — Ambulatory Visit
Admission: RE | Admit: 2018-04-07 | Discharge: 2018-04-07 | Disposition: A | Discharge status: Home / Self Care | Payer: Medicare Other | Source: Ambulatory Visit | Attending: Radiation Oncology | Admitting: Radiation Oncology

## 2018-04-07 DIAGNOSIS — Z510 Encounter for antineoplastic radiation therapy: Secondary | ICD-10-CM | POA: Diagnosis not present

## 2018-04-08 ENCOUNTER — Ambulatory Visit
Admission: RE | Admit: 2018-04-08 | Discharge: 2018-04-08 | Disposition: A | Discharge status: Home / Self Care | Payer: Medicare Other | Source: Ambulatory Visit | Attending: Radiation Oncology | Admitting: Radiation Oncology

## 2018-04-08 DIAGNOSIS — Z510 Encounter for antineoplastic radiation therapy: Secondary | ICD-10-CM | POA: Diagnosis not present

## 2018-04-09 ENCOUNTER — Ambulatory Visit
Admission: RE | Admit: 2018-04-09 | Discharge: 2018-04-09 | Disposition: A | Discharge status: Home / Self Care | Payer: Medicare Other | Source: Ambulatory Visit | Attending: Radiation Oncology | Admitting: Radiation Oncology

## 2018-04-09 DIAGNOSIS — Z510 Encounter for antineoplastic radiation therapy: Secondary | ICD-10-CM | POA: Diagnosis not present

## 2018-04-10 ENCOUNTER — Ambulatory Visit
Admission: RE | Admit: 2018-04-10 | Discharge: 2018-04-10 | Disposition: A | Discharge status: Home / Self Care | Payer: Medicare Other | Source: Ambulatory Visit | Attending: Radiation Oncology | Admitting: Radiation Oncology

## 2018-04-10 DIAGNOSIS — Z510 Encounter for antineoplastic radiation therapy: Secondary | ICD-10-CM | POA: Diagnosis not present

## 2018-04-13 ENCOUNTER — Ambulatory Visit
Admission: RE | Admit: 2018-04-13 | Discharge: 2018-04-13 | Disposition: A | Discharge status: Home / Self Care | Payer: Medicare Other | Source: Ambulatory Visit | Attending: Radiation Oncology | Admitting: Radiation Oncology

## 2018-04-13 DIAGNOSIS — Z510 Encounter for antineoplastic radiation therapy: Secondary | ICD-10-CM | POA: Diagnosis not present

## 2018-04-14 ENCOUNTER — Ambulatory Visit
Admission: RE | Admit: 2018-04-14 | Discharge: 2018-04-14 | Disposition: A | Discharge status: Home / Self Care | Payer: Medicare Other | Source: Ambulatory Visit | Attending: Radiation Oncology | Admitting: Radiation Oncology

## 2018-04-14 DIAGNOSIS — Z510 Encounter for antineoplastic radiation therapy: Secondary | ICD-10-CM | POA: Diagnosis not present

## 2018-04-15 ENCOUNTER — Ambulatory Visit
Admission: RE | Admit: 2018-04-15 | Discharge: 2018-04-15 | Disposition: A | Discharge status: Home / Self Care | Payer: Medicare Other | Source: Ambulatory Visit | Attending: Radiation Oncology | Admitting: Radiation Oncology

## 2018-04-15 DIAGNOSIS — Z510 Encounter for antineoplastic radiation therapy: Secondary | ICD-10-CM | POA: Diagnosis not present

## 2018-04-16 ENCOUNTER — Ambulatory Visit
Admission: RE | Admit: 2018-04-16 | Discharge: 2018-04-16 | Disposition: A | Discharge status: Home / Self Care | Payer: Medicare Other | Source: Ambulatory Visit | Attending: Radiation Oncology | Admitting: Radiation Oncology

## 2018-04-16 DIAGNOSIS — Z510 Encounter for antineoplastic radiation therapy: Secondary | ICD-10-CM | POA: Diagnosis not present

## 2018-04-17 ENCOUNTER — Encounter: Payer: Self-pay | Admitting: Medical Oncology

## 2018-04-17 ENCOUNTER — Ambulatory Visit
Admission: RE | Admit: 2018-04-17 | Discharge: 2018-04-17 | Disposition: A | Discharge status: Home / Self Care | Payer: Medicare Other | Source: Ambulatory Visit | Attending: Radiation Oncology | Admitting: Radiation Oncology

## 2018-04-17 DIAGNOSIS — Z510 Encounter for antineoplastic radiation therapy: Secondary | ICD-10-CM | POA: Diagnosis not present

## 2018-04-19 ENCOUNTER — Ambulatory Visit: Admission: RE | Admit: 2018-04-19 | Payer: Medicare Other | Source: Ambulatory Visit

## 2018-04-20 ENCOUNTER — Ambulatory Visit
Admission: RE | Admit: 2018-04-20 | Discharge: 2018-04-20 | Disposition: A | Discharge status: Home / Self Care | Payer: Medicare Other | Source: Ambulatory Visit | Attending: Radiation Oncology | Admitting: Radiation Oncology

## 2018-04-20 DIAGNOSIS — Z510 Encounter for antineoplastic radiation therapy: Secondary | ICD-10-CM | POA: Diagnosis not present

## 2018-04-21 ENCOUNTER — Ambulatory Visit
Admission: RE | Admit: 2018-04-21 | Discharge: 2018-04-21 | Disposition: A | Discharge status: Home / Self Care | Payer: Medicare Other | Source: Ambulatory Visit | Attending: Radiation Oncology | Admitting: Radiation Oncology

## 2018-04-21 DIAGNOSIS — Z510 Encounter for antineoplastic radiation therapy: Secondary | ICD-10-CM | POA: Diagnosis not present

## 2018-04-21 NOTE — Progress Notes (Signed)
Maurice Farmer states he is tolerating radiation well. No major complaints.

## 2018-04-22 ENCOUNTER — Ambulatory Visit
Admission: RE | Admit: 2018-04-22 | Discharge: 2018-04-22 | Disposition: A | Discharge status: Home / Self Care | Payer: Medicare Other | Source: Ambulatory Visit | Attending: Radiation Oncology | Admitting: Radiation Oncology

## 2018-04-22 ENCOUNTER — Encounter: Payer: Self-pay | Admitting: Medical Oncology

## 2018-04-22 DIAGNOSIS — Z510 Encounter for antineoplastic radiation therapy: Secondary | ICD-10-CM | POA: Diagnosis not present

## 2018-04-24 ENCOUNTER — Encounter: Payer: Self-pay | Admitting: Radiation Oncology

## 2018-04-24 NOTE — Progress Notes (Signed)
  Radiation Oncology         [phone] ________________________________  Name: Maurice Farmer MRN: [id number]  Date: 04/24/2018  DOB: March 30, 1946  End of Treatment Note  Diagnosis:   72 y.o. gentleman with Stage T1c adenocarcinoma of the prostate with Gleason Score of 3+4, and PSA of 8.79      Indication for treatment:  Curative, Definitive Radiotherapy       Radiation treatment dates:   03/16/18 - 04/22/18  Site/dose:   The prostate was treated to 70 Gy in 28 fractions of 2.5 Gy  Beams/energy:   The patient was treated with IMRT using volumetric arc therapy delivering 6 MV X-rays to clockwise and counterclockwise circumferential arcs with a 90 degree collimator offset to avoid dose scalloping.  Image guidance was performed with daily cone beam CT prior to each fraction to align to gold markers in the prostate and assure proper bladder and rectal fill volumes.  Immobilization was achieved with [employer] custom mold.  Narrative: The patient tolerated radiation treatment relatively well.  He denied pain, vomiting, and urinary urgency throughout treatment. In the beginning treatments, he reported nocturia 2-3x, mild dysuria, urinary hesitancy, occasional weak stream and difficulty emptying, scant leakage, increased frequency of bowel movements, and nausea. Later into treatment, he reported throbbing with voiding, intermittent diarrhea, increasing and decreasing fatigue, and soft to loose bowel movements 2-3x daily. Towards the end of treatment, he noted improved burning and frequency, rectal pain, dysuria, hesitancy, and stream strength.  Plan: The patient has completed radiation treatment. He will return to radiation oncology clinic for routine followup in one month. I advised him to call or return sooner if he has any questions or concerns related to his recovery or treatment. ________________________________  Sheral Apley. Tammi Klippel, M.D.  This document serves as a record of services personally  performed by Tyler Pita, MD. It was created on his behalf by Wilburn Mylar, a trained medical scribe. The creation of this record is based on the scribe's personal observations and the provider's statements to them. This document has been checked and approved by the attending provider.

## 2018-05-29 ENCOUNTER — Ambulatory Visit: Payer: Self-pay | Admitting: Urology

## 2018-06-03 ENCOUNTER — Encounter: Payer: Self-pay | Admitting: Urology

## 2018-06-03 ENCOUNTER — Other Ambulatory Visit: Payer: Self-pay

## 2018-06-03 ENCOUNTER — Ambulatory Visit
Admission: RE | Admit: 2018-06-03 | Discharge: 2018-06-03 | Disposition: A | Discharge status: Home / Self Care | Payer: Medicare Other | Source: Ambulatory Visit | Attending: Urology | Admitting: Urology

## 2018-06-03 VITALS — BP 109/70 | HR 87 | Temp 98.9°F | Resp 20 | Ht 70.0 in | Wt 239.0 lb

## 2018-06-03 DIAGNOSIS — Z923 Personal history of irradiation: Secondary | ICD-10-CM | POA: Diagnosis not present

## 2018-06-03 DIAGNOSIS — Z79899 Other long term (current) drug therapy: Secondary | ICD-10-CM | POA: Insufficient documentation

## 2018-06-03 DIAGNOSIS — C61 Malignant neoplasm of prostate: Secondary | ICD-10-CM | POA: Insufficient documentation

## 2018-06-03 NOTE — Progress Notes (Signed)
Radiation Oncology         [phone]) [phone] ________________________________  Name: Maurice Farmer MRN: [id number]  Date: 06/03/2018  DOB: 1946/03/11  Post Treatment Note  CC: Sallee Provencal, MD  Raynelle Bring, MD  Diagnosis:   72 y.o. gentleman with Stage T1c adenocarcinoma of the prostate with Gleason Score of 3+4, and PSA of 8.79     Interval Since Last Radiation:  5 weeks  03/16/18 - 04/22/18:  The prostate was treated to 70 Gy in 28 fractions of 2.5 Gy  Narrative:  The patient returns today for routine follow-up. He tolerated radiation treatment relatively well.  He denied pain, vomiting, and urinary urgency throughout treatment. In the beginning treatments, he reported nocturia 2-3x, mild dysuria, urinary hesitancy, occasional weak stream and difficulty emptying, scant leakage, increased frequency of bowel movements, and nausea. Later into treatment, he reported throbbing with voiding, intermittent diarrhea, increasing and decreasing fatigue, and soft to loose bowel movements 2-3x daily. Towards the end of treatment, he noted improved burning and frequency, rectal pain, dysuria, hesitancy, and stream strength.                              On review of systems, the patient states that he is doing very well overall.  His current IPSS score is 5 indicating mild urinary symptoms only.  He reports nocturia x2 with occasional intermittency but specifically denies dysuria, gross hematuria, excessive daytime frequency, urgency, weak stream, straining or incontinence.  He feels that he empties his bladder well on voiding.  He reports a healthy appetite and denies abdominal pain, nausea, vomiting, diarrhea or constipation.  He reports gradual improvement in his fatigue.  Overall, he is quite pleased with his progress to date.  ALLERGIES:  is allergic to bee venom.  Meds: Current Outpatient Medications  Medication Sig Dispense Refill  . albuterol (PROVENTIL HFA;VENTOLIN HFA) 108 (90 BASE) MCG/ACT  inhaler Inhale 2 puffs into the lungs every 6 (six) hours as needed for wheezing.    Marland Kitchen atorvastatin (LIPITOR) 80 MG tablet Take 20 mg by mouth every 3 (three) days. Every 3 day    . cetirizine (ZYRTEC) 10 MG tablet 10 mg every evening.   3  . chlorthalidone (HYGROTON) 50 MG tablet Take 50 mg by mouth daily. patient takes 1/2 tablet daily     . Cholecalciferol (VITAMIN D3) 2000 UNITS TABS Take 200 Units by mouth daily.    . Cyanocobalamin (VITAMIN B 12 PO) Take 4,000 mg by mouth daily.    . diphenhydrAMINE (BENADRYL) 25 MG tablet Take 25 mg by mouth every evening.     Marland Kitchen EPINEPHrine (EPIPEN 2-PAK) 0.3 mg/0.3 mL IJ SOAJ injection Inject into the muscle.    . fluticasone (FLONASE) 50 MCG/ACT nasal spray Flonase Allergy Relief 50 mcg/actuation nasal spray,suspension  Spray 1 spray every day by intranasal route.as needed    . Fluticasone-Salmeterol (ADVAIR DISKUS) 100-50 MCG/DOSE AEPB Advair Diskus 100 mcg-50 mcg/dose powder for inhalation  Inhale 1 puff twice a day by inhalation route.    . gabapentin (NEURONTIN) 400 MG capsule 400 mg 2 (two) times daily.   0  . montelukast (SINGULAIR) 10 MG tablet Take by mouth at bedtime.     . nitroGLYCERIN (NITROSTAT) 0.4 MG SL tablet Place 0.4 mg under the tongue every 5 (five) minutes as needed for chest pain.    Marland Kitchen omeprazole (PRILOSEC) 20 MG capsule Take 20 mg by mouth 2 (two) times daily  before a meal.     . oxyCODONE-acetaminophen (PERCOCET) 10-325 MG per tablet Take 1 tablet by mouth every 4 (four) hours as needed for pain.    . potassium chloride SA (K-DUR,KLOR-CON) 20 MEQ tablet TAKE 4TABLET(20 MEQ) BY MOUTH DAILY    . Spacer/Aero-Holding Chambers (AEROCHAMBER W/FLOWSIGNAL) inhaler Use as instructed.    . tamsulosin (FLOMAX) 0.4 MG CAPS capsule Take 0.4 mg by mouth 2 (two) times daily.     Marland Kitchen triamcinolone cream (KENALOG) 0.1 % Apply topically as needed.     . vitamin C (ASCORBIC ACID) 500 MG tablet Take 1,000 mg by mouth daily.      No current  facility-administered medications for this visit.     Physical Findings:  vitals were not taken for this visit.   /10 In general this is a well appearing Caucasian male in no acute distress.  He's alert and oriented x4 and appropriate throughout the examination. Cardiopulmonary assessment is negative for acute distress and he exhibits normal effort.   Lab Findings: Lab Results  Component Value Date   WBC 5.6 03/30/2013   HGB 14.6 02/19/2018   HCT 43.0 02/19/2018   MCV 90.2 03/30/2013   PLT 166 03/30/2013     Radiographic Findings: No results found.  Impression/Plan: 1. 72 y.o. gentleman with Stage T1c adenocarcinoma of the prostate with Gleason Score of 3+4, and PSA of 8.79.   He will continue to follow up with urology for ongoing PSA determinations and has an appointment scheduled with Dr. Alinda Money on 08/07/18. He understands what to expect with regards to PSA monitoring going forward. I will look forward to following his response to treatment via correspondence with urology, and would be happy to continue to participate in his care if clinically indicated. I talked to the patient about what to expect in the future, including his risk for erectile dysfunction and rectal bleeding. I encouraged him to call or return to the office if he has any questions regarding his previous radiation or possible radiation side effects. He was comfortable with this plan and will follow up as needed.    Nicholos Johns, PA-C

## 2019-06-24 IMAGING — MR MR PROSTATE WO/W CM
56 series · 56 of 56 positions shown · IV contrast (Multihance 19ml)
Comparison: Prostate MRI 07/30/2013

CLINICAL DATA: Prostate adenocarcinoma (Gleason 3 + 3 equals 6).
Carcinoma identified on biopsy of the LEFT base.

EXAM:
MR PROSTATE WITHOUT AND WITH CONTRAST
TECHNIQUE: Multiplanar multisequence MRI images were obtained of the pelvis
centered about the prostate. Pre and post contrast images were
obtained.
CONTRAST:  20mL MULTIHANCE GADOBENATE DIMEGLUMINE 529 MG/ML IV SOLN
Creatinine was obtained on site at [HOSPITAL] at [HOSPITAL].
Results: Creatinine 1.2 mg/dL.

[Series 3: T1 · axial · 8.0mm · 1.06mm/px · 1 of 20 slices shown (1 of 2)]
[im 1/20]
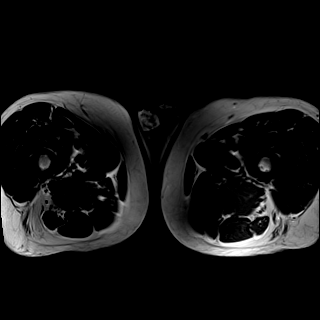

[Series 4: bSSFP fat-sat · axial · 8.0mm · 0.74mm/px · 1 of 30 slices shown]
[im 1/30]
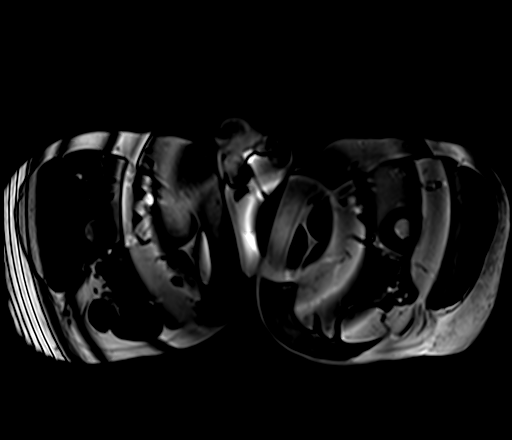

[Series 5: T2 · sagittal · 3.5mm · 0.56mm/px · 1 of 41 slices shown (1 of 4)]
[im 1/41]
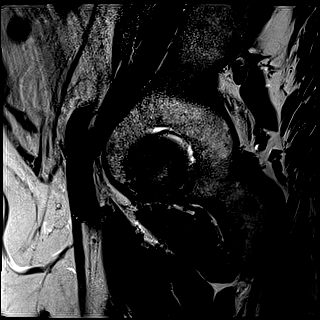

[Series 6: T1 · axial · 3.0mm · 0.31mm/px · 1 of 40 slices shown (2 of 2)]
[im 1/40]
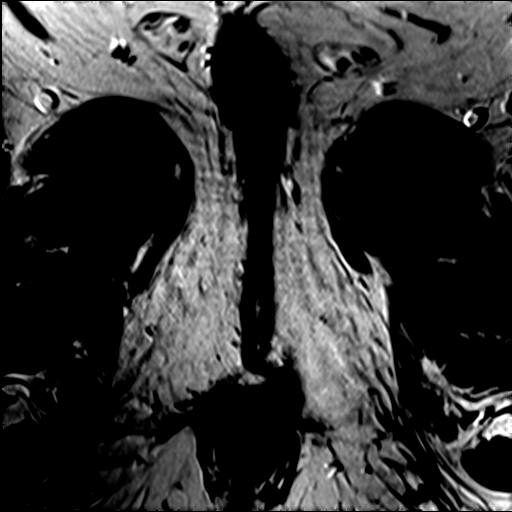

[Series 7: T2 · axial · 3.0mm · 0.56mm/px · 1 of 40 slices shown (2 of 4)]
[im 1/40]
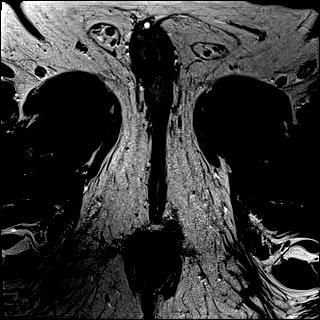

[Series 8: T2 · axial · 1.0mm · 1.04mm/px · 1 of 80 slices shown (3 of 4)]
[im 1/80]
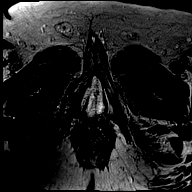

[Series 9: T2 · coronal · 3.5mm · 0.56mm/px · 1 of 27 slices shown (4 of 4)]
[im 1/27]
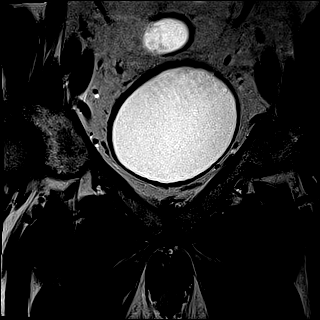

[Series 10: DWI · axial · 3.5mm · 1.56mm/px · 1 of 60 slices shown (1 of 2)]
[im 1/60]
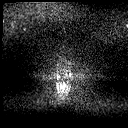

[Series 11: DWI · axial · 3.5mm · 1.56mm/px · 1 of 20 slices shown (2 of 2)]
[im 1/20]
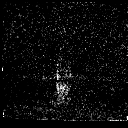

[Series 12: t1_twist_tra_dyn_ttc=6.4s · axial · 3.5mm · 0.83mm/px · 1 of 24 slices shown]
[im 1/24]
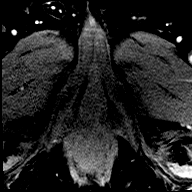

[Series 13: t1_twist_tra_dyn-copy center to · axial · 3.5mm · 0.83mm/px · 1 of 24 slices shown (1 of 24)]
[im 1/24]
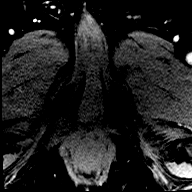

[Series 14: t1_twist_tra_dyn-copy center to · axial · 3.5mm · 0.83mm/px · 1 of 24 slices shown (2 of 24)]
[im 1/24]
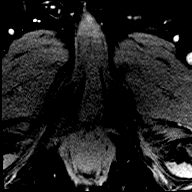

[Series 15: t1_twist_tra_dyn-copy center to_sub_ttc=(id) · axial · 3.5mm · 0.83mm/px · 1 of 20 slices shown (1 of 22)]
[im 1/20]
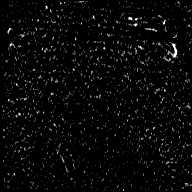

[Series 16: t1_twist_tra_dyn-copy center to · axial · 3.5mm · 0.83mm/px · 1 of 24 slices shown (3 of 24)]
[im 1/24]
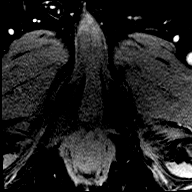

[Series 17: t1_twist_tra_dyn-copy center to_sub_ttc=(id) · axial · 3.5mm · 0.83mm/px · 1 of 23 slices shown (2 of 22)]
[im 1/23]
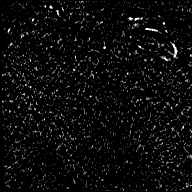

[Series 18: t1_twist_tra_dyn-copy center to · axial · 3.5mm · 0.83mm/px · 1 of 24 slices shown (4 of 24)]
[im 1/24]
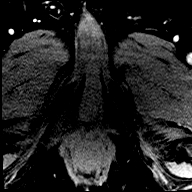

[Series 19: t1_twist_tra_dyn-copy center to_sub_ttc=(id) · axial · 3.5mm · 0.83mm/px · 1 of 24 slices shown (3 of 22)]
[im 1/24]
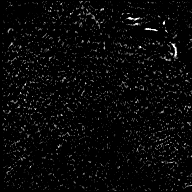

[Series 20: t1_twist_tra_dyn-copy center to · axial · 3.5mm · 0.83mm/px · 1 of 24 slices shown (5 of 24)]
[im 1/24]
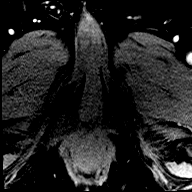

[Series 21: t1_twist_tra_dyn-copy center to_sub_ttc=(id) · axial · 3.5mm · 0.83mm/px · 1 of 23 slices shown (4 of 22)]
[im 1/23]
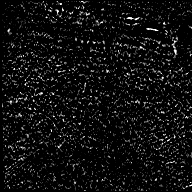

[Series 22: t1_twist_tra_dyn-copy center to · axial · 3.5mm · 0.83mm/px · 1 of 24 slices shown (6 of 24)]
[im 1/24]
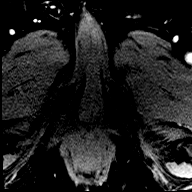

[Series 23: t1_twist_tra_dyn-copy center to_sub_ttc=(id) · axial · 3.5mm · 0.83mm/px · 1 of 24 slices shown (5 of 22)]
[im 1/24]
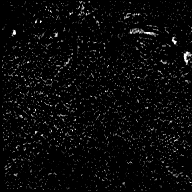

[Series 24: t1_twist_tra_dyn-copy center to · axial · 3.5mm · 0.83mm/px · 1 of 24 slices shown (7 of 24)]
[im 1/24]
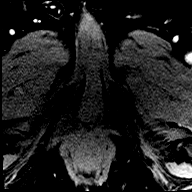

[Series 25: t1_twist_tra_dyn-copy center to_sub_ttc=(id) · axial · 3.5mm · 0.83mm/px · 1 of 24 slices shown (6 of 22)]
[im 1/24]
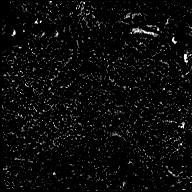

[Series 26: t1_twist_tra_dyn-copy center to · axial · 3.5mm · 0.83mm/px · 1 of 24 slices shown (8 of 24)]
[im 1/24]
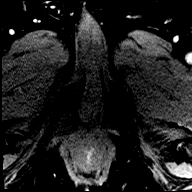

[Series 27: t1_twist_tra_dyn-copy center to_sub_ttc=(id) · axial · 3.5mm · 0.83mm/px · 1 of 24 slices shown (7 of 22)]
[im 1/24]
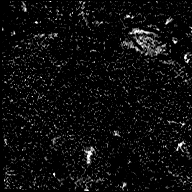

[Series 28: t1_twist_tra_dyn-copy center to · axial · 3.5mm · 0.83mm/px · 1 of 24 slices shown (9 of 24)]
[im 1/24]
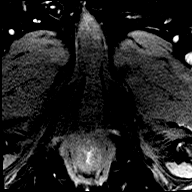

[Series 29: t1_twist_tra_dyn-copy center to_sub_ttc=(id) · axial · 3.5mm · 0.83mm/px · 1 of 24 slices shown (8 of 22)]
[im 1/24]
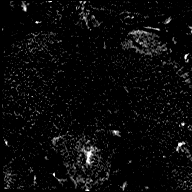

[Series 30: t1_twist_tra_dyn-copy center to · axial · 3.5mm · 0.83mm/px · 1 of 24 slices shown (10 of 24)]
[im 1/24]
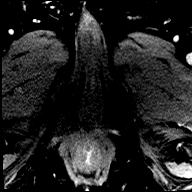

[Series 31: t1_twist_tra_dyn-copy center to_sub_ttc=(id) · axial · 3.5mm · 0.83mm/px · 1 of 24 slices shown (9 of 22)]
[im 1/24]
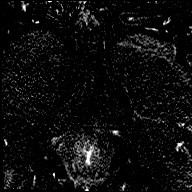

[Series 32: t1_twist_tra_dyn-copy center to · axial · 3.5mm · 0.83mm/px · 1 of 24 slices shown (11 of 24)]
[im 1/24]
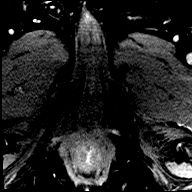

[Series 33: t1_twist_tra_dyn-copy center to_sub_ttc=(id) · axial · 3.5mm · 0.83mm/px · 1 of 24 slices shown (10 of 22)]
[im 1/24]
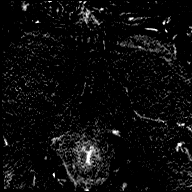

[Series 34: t1_twist_tra_dyn-copy center to · axial · 3.5mm · 0.83mm/px · 1 of 24 slices shown (12 of 24)]
[im 1/24]
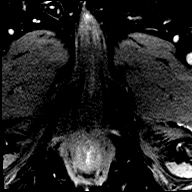

[Series 35: t1_twist_tra_dyn-copy center to_sub_ttc=(id) · axial · 3.5mm · 0.83mm/px · 1 of 24 slices shown (11 of 22)]
[im 1/24]
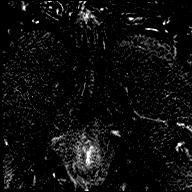

[Series 36: t1_twist_tra_dyn-copy center to · axial · 3.5mm · 0.83mm/px · 1 of 24 slices shown (13 of 24)]
[im 1/24]
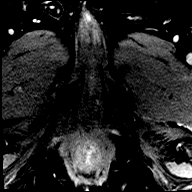

[Series 37: t1_twist_tra_dyn-copy center to_sub_ttc=(id) · axial · 3.5mm · 0.83mm/px · 1 of 24 slices shown (12 of 22)]
[im 1/24]
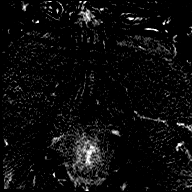

[Series 38: t1_twist_tra_dyn-copy center to · axial · 3.5mm · 0.83mm/px · 1 of 24 slices shown (14 of 24)]
[im 1/24]
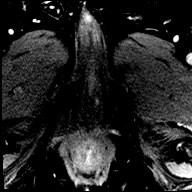

[Series 39: t1_twist_tra_dyn-copy center to_sub_ttc=(id) · axial · 3.5mm · 0.83mm/px · 1 of 24 slices shown (13 of 22)]
[im 1/24]
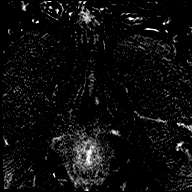

[Series 40: t1_twist_tra_dyn-copy center to · axial · 3.5mm · 0.83mm/px · 1 of 24 slices shown (15 of 24)]
[im 1/24]
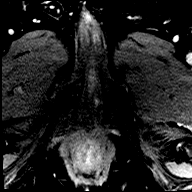

[Series 41: t1_twist_tra_dyn-copy center to_sub_ttc=(id) · axial · 3.5mm · 0.83mm/px · 1 of 24 slices shown (14 of 22)]
[im 1/24]
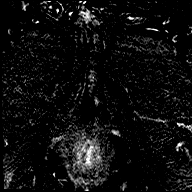

[Series 42: t1_twist_tra_dyn-copy center to · axial · 3.5mm · 0.83mm/px · 1 of 24 slices shown (16 of 24)]
[im 1/24]
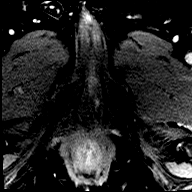

[Series 43: t1_twist_tra_dyn-copy center to_sub_ttc=(id) · axial · 3.5mm · 0.83mm/px · 1 of 24 slices shown (15 of 22)]
[im 1/24]
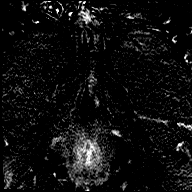

[Series 44: t1_twist_tra_dyn-copy center to · axial · 3.5mm · 0.83mm/px · 1 of 24 slices shown (17 of 24)]
[im 1/24]
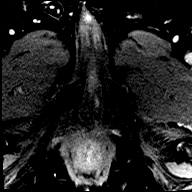

[Series 45: t1_twist_tra_dyn-copy center to_sub_ttc=(id) · axial · 3.5mm · 0.83mm/px · 1 of 24 slices shown (16 of 22)]
[im 1/24]
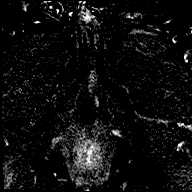

[Series 46: t1_twist_tra_dyn-copy center to · axial · 3.5mm · 0.83mm/px · 1 of 24 slices shown (18 of 24)]
[im 1/24]
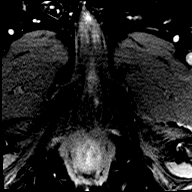

[Series 47: t1_twist_tra_dyn-copy center to_sub_ttc=(id) · axial · 3.5mm · 0.83mm/px · 1 of 24 slices shown (17 of 22)]
[im 1/24]
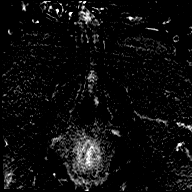

[Series 48: t1_twist_tra_dyn-copy center to · axial · 3.5mm · 0.83mm/px · 1 of 24 slices shown (19 of 24)]
[im 1/24]
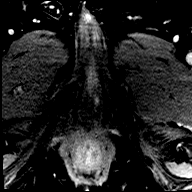

[Series 49: t1_twist_tra_dyn-copy center to_sub_ttc=(id) · axial · 3.5mm · 0.83mm/px · 1 of 24 slices shown (18 of 22)]
[im 1/24]
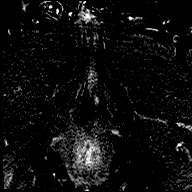

[Series 50: t1_twist_tra_dyn-copy center to · axial · 3.5mm · 0.83mm/px · 1 of 24 slices shown (20 of 24)]
[im 1/24]
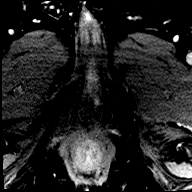

[Series 51: t1_twist_tra_dyn-copy center to_sub_ttc=(id) · axial · 3.5mm · 0.83mm/px · 1 of 24 slices shown (19 of 22)]
[im 1/24]
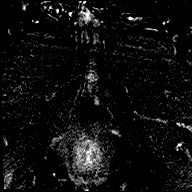

[Series 52: t1_twist_tra_dyn-copy center to · axial · 3.5mm · 0.83mm/px · 1 of 24 slices shown (21 of 24)]
[im 1/24]
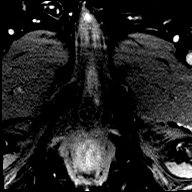

[Series 53: t1_twist_tra_dyn-copy center to_sub_ttc=(id) · axial · 3.5mm · 0.83mm/px · 1 of 24 slices shown (20 of 22)]
[im 1/24]
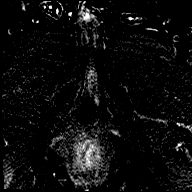

[Series 54: t1_twist_tra_dyn-copy center to · axial · 3.5mm · 0.83mm/px · 1 of 24 slices shown (22 of 24)]
[im 1/24]
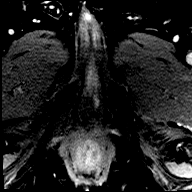

[Series 55: t1_twist_tra_dyn-copy center to_sub_ttc=(id) · axial · 3.5mm · 0.83mm/px · 1 of 24 slices shown (21 of 22)]
[im 1/24]
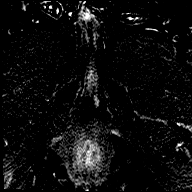

[Series 56: t1_twist_tra_dyn-copy center to · axial · 3.5mm · 0.83mm/px · 1 of 24 slices shown (23 of 24)]
[im 1/24]
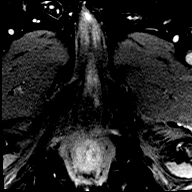

[Series 57: t1_twist_tra_dyn-copy center to_sub_ttc=(id) · axial · 3.5mm · 0.83mm/px · 1 of 24 slices shown (22 of 22)]
[im 1/24]
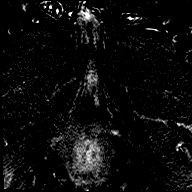

[Series 58: t1_twist_tra_dyn-copy center to · axial · 3.5mm · 0.83mm/px · 1 of 24 slices shown (24 of 24)]
[im 1/24]
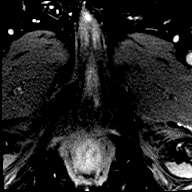

[56 of 56 positions shown; findings below may reference images not displayed]

FINDINGS: Prostate: The peripheral zone is thin. There is no discrete lesion
on the T2 weighted imaging within the peripheral zone (series 7). No
foci of restricted diffusion on the diffusion-weighted series
(series 11).

The transitional zone is enlarged and nodular with well
circumscribed nodules.

The prostatic capsule is intact. Neuro vascular bundles are intact.
The rectoprostatic angles are sharp.

Volume: 5.5 x 4.3 by 5.0 cm (volume = 62 cm^3)

Transcapsular spread:  Absent

Seminal vesicle involvement: Absent

Neurovascular bundle involvement: Absent

Pelvic adenopathy: Absent

Bone metastasis: Absent

Other findings: None
IMPRESSION: 1. No evidence of high-grade carcinoma within the peripheral zone.
2. Nodular transitional zone consistent benign prostate hypertrophy
3. Gland volume estimation equals 62 g.
